# Patient Record
Sex: Female | Born: 1955 | Race: Black or African American | Hispanic: No | Smoking: Current every day smoker
Health system: Southern US, Community
[De-identification: ages and names within clinical notes are randomized; demographics above are authoritative.]

## PROBLEM LIST (undated history)

## (undated) DIAGNOSIS — G8929 Other chronic pain: Secondary | ICD-10-CM

## (undated) DIAGNOSIS — I10 Essential (primary) hypertension: Secondary | ICD-10-CM

## (undated) DIAGNOSIS — K859 Acute pancreatitis without necrosis or infection, unspecified: Secondary | ICD-10-CM

## (undated) DIAGNOSIS — J45909 Unspecified asthma, uncomplicated: Secondary | ICD-10-CM

## (undated) DIAGNOSIS — F419 Anxiety disorder, unspecified: Secondary | ICD-10-CM

## (undated) DIAGNOSIS — M1990 Unspecified osteoarthritis, unspecified site: Secondary | ICD-10-CM

## (undated) DIAGNOSIS — E559 Vitamin D deficiency, unspecified: Secondary | ICD-10-CM

## (undated) DIAGNOSIS — H409 Unspecified glaucoma: Secondary | ICD-10-CM

## (undated) HISTORY — DX: Vitamin D deficiency, unspecified: E55.9

## (undated) HISTORY — PX: SHOULDER SURGERY: SHX246

---

## 2003-03-31 ENCOUNTER — Emergency Department (HOSPITAL_COMMUNITY): Admission: EM | Admit: 2003-03-31 | Discharge: 2003-03-31 | Payer: Self-pay | Admitting: Emergency Medicine

## 2003-08-28 ENCOUNTER — Inpatient Hospital Stay (HOSPITAL_COMMUNITY): Admission: EM | Admit: 2003-08-28 | Discharge: 2003-09-01 | Payer: Self-pay | Admitting: Emergency Medicine

## 2006-06-17 ENCOUNTER — Other Ambulatory Visit: Payer: Self-pay

## 2006-06-30 ENCOUNTER — Ambulatory Visit: Payer: Self-pay | Admitting: Cardiovascular Disease

## 2006-07-27 ENCOUNTER — Inpatient Hospital Stay (HOSPITAL_COMMUNITY): Admission: EM | Admit: 2006-07-27 | Discharge: 2006-07-29 | Payer: Self-pay | Admitting: Emergency Medicine

## 2006-09-08 ENCOUNTER — Ambulatory Visit: Payer: Self-pay | Admitting: Orthopaedic Surgery

## 2007-05-06 IMAGING — CR DG CHEST 2V
2 series · 2 of 2 positions shown · non-contrast
Comparison: 08/31/03.

CLINICAL DATA: Smoker.  History of asthma.  Dyspnea and chest tightness.  
 CHEST - 2 VIEW:

[view not recorded (1 of 2)]
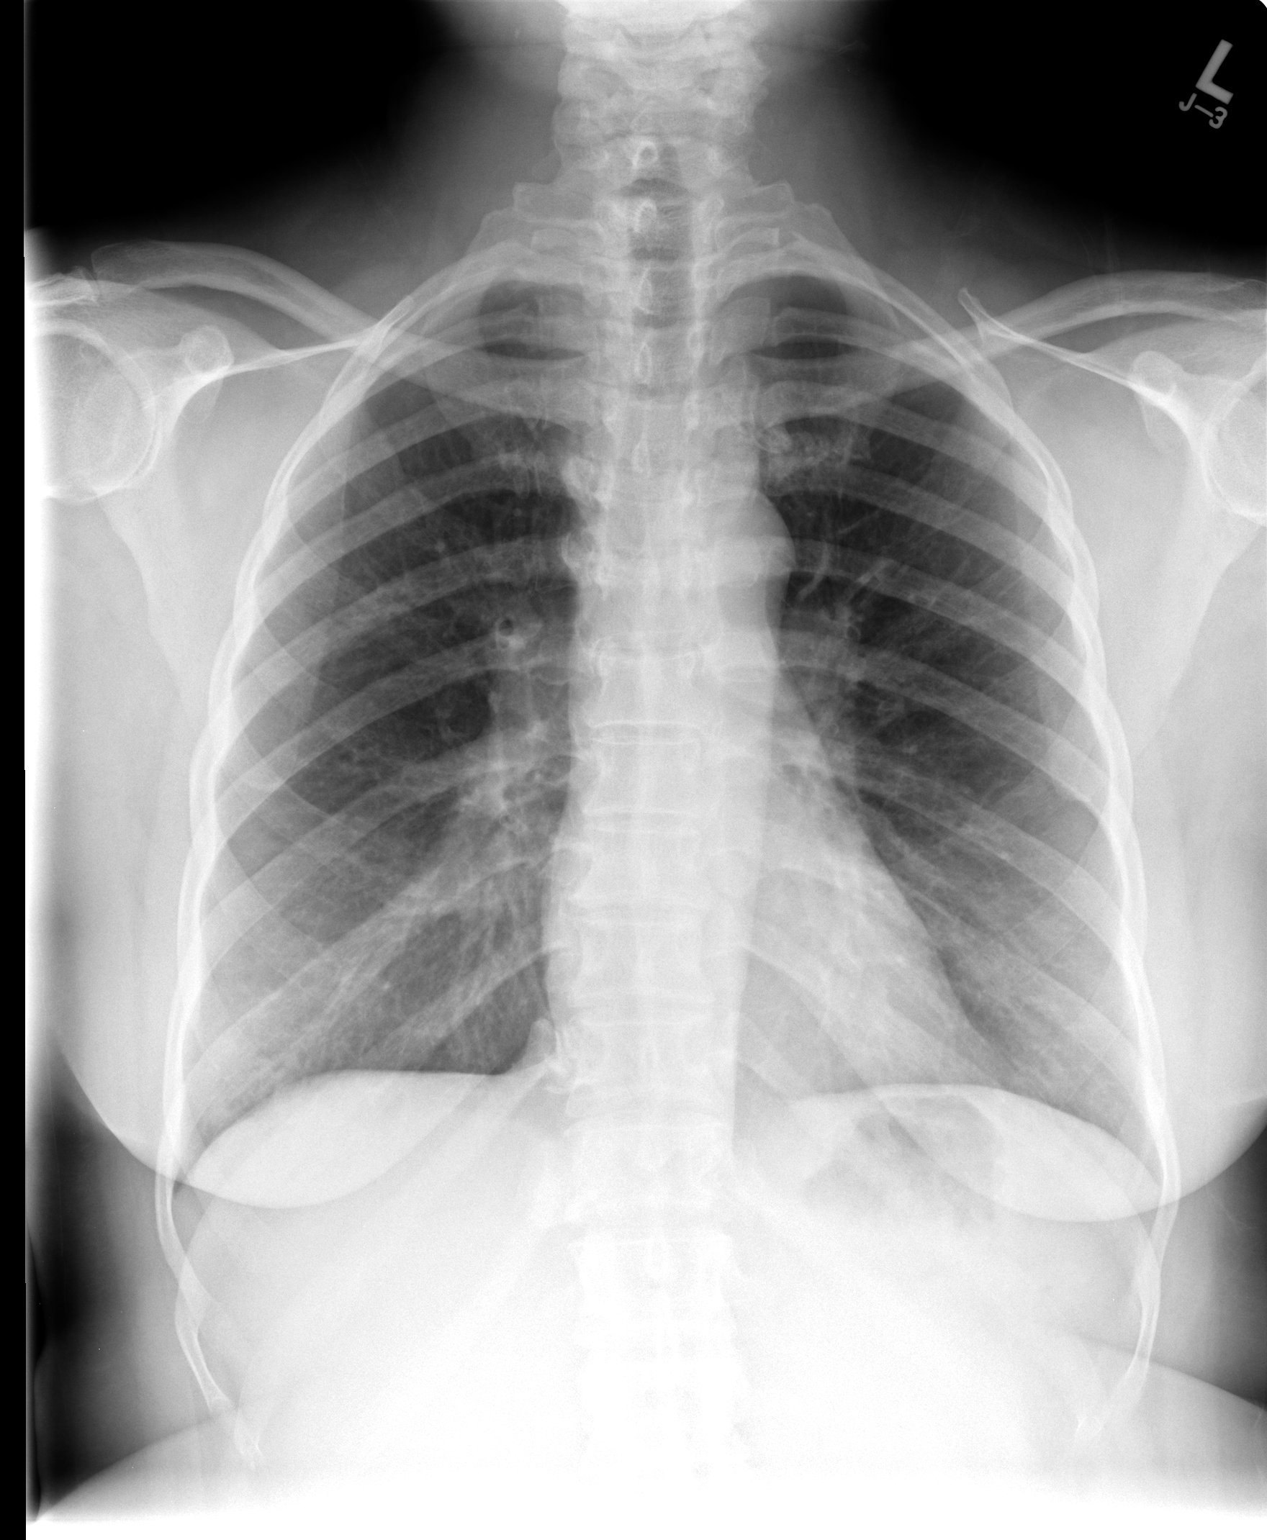

[view not recorded (2 of 2)]
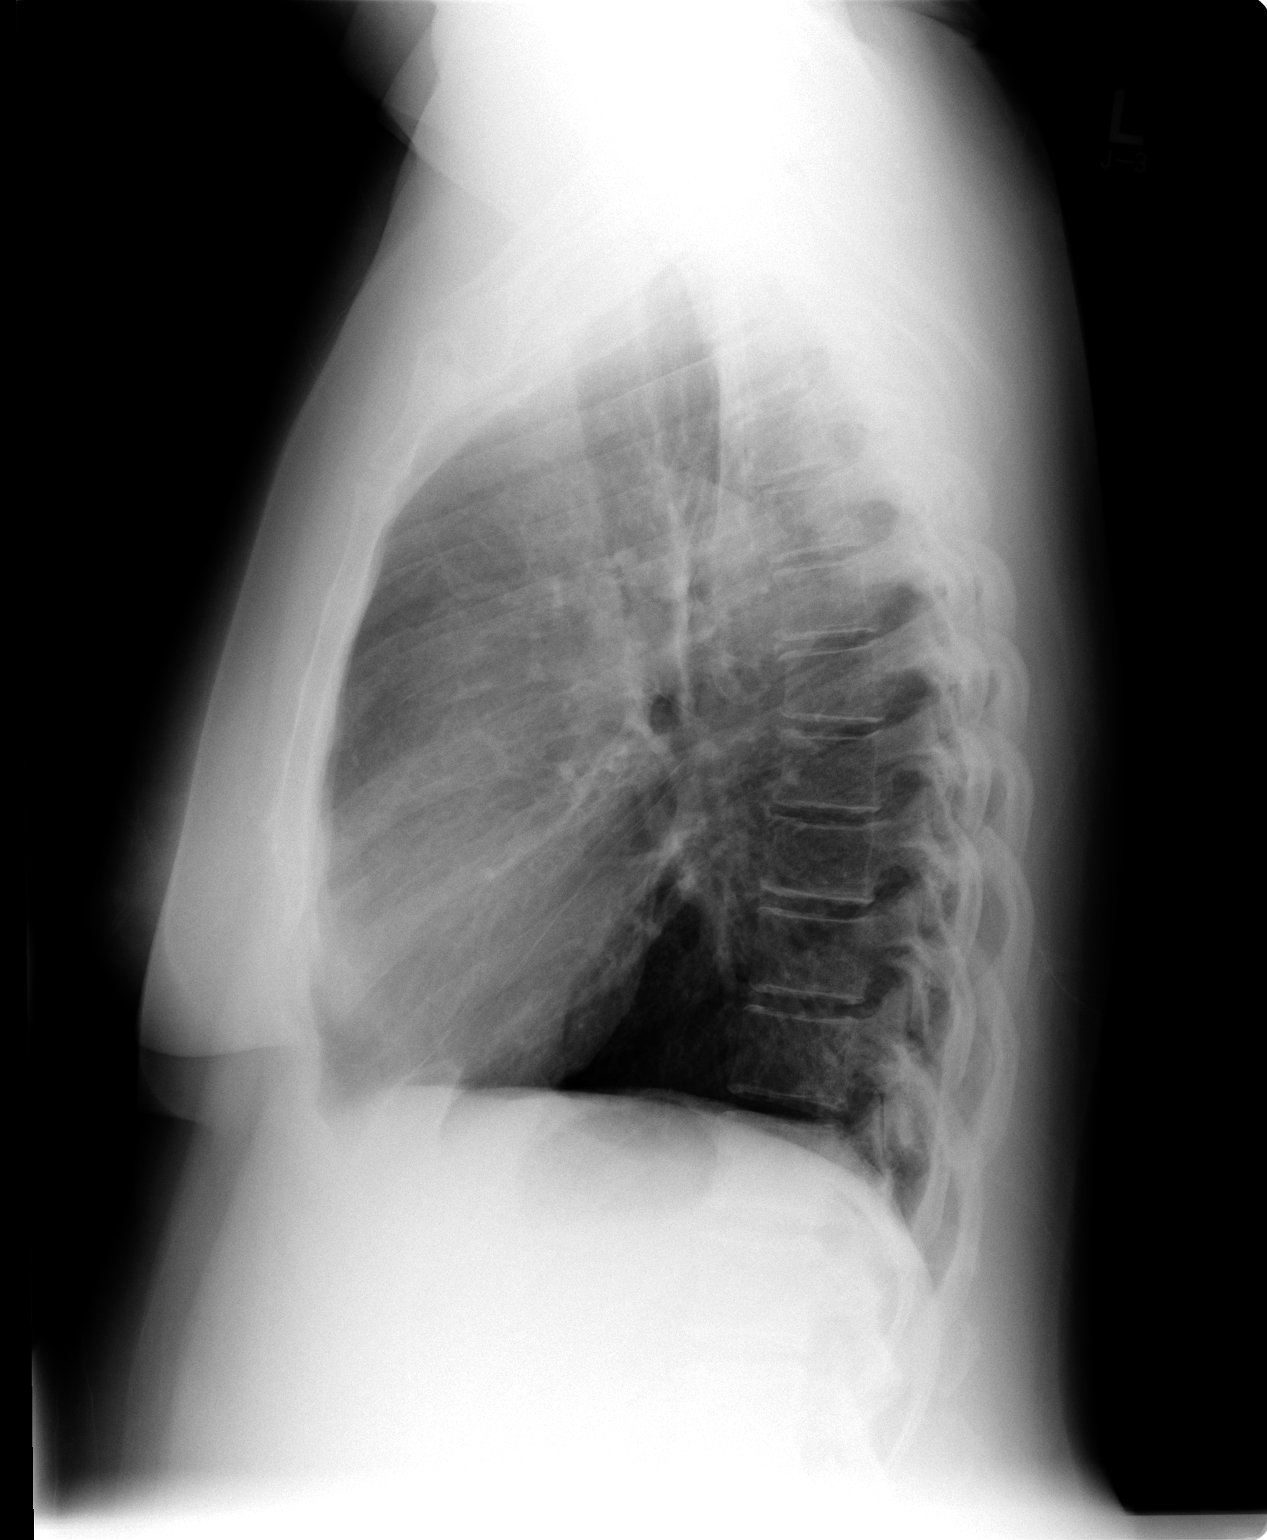

[2 of 2 positions shown; findings below may reference images not displayed]

FINDINGS: The previously demonstrated right lung air space disease has resolved. The lungs are now clear aside from chronic central airway thickening.  There is no pleural effusion or pneumothorax.  The cardiomediastinal contours appear stable.
IMPRESSION: Chronic central airway thickening compatible with bronchitis or asthma.  No evidence of pneumonia.

## 2007-06-21 ENCOUNTER — Ambulatory Visit (HOSPITAL_COMMUNITY): Admission: RE | Admit: 2007-06-21 | Discharge: 2007-06-21 | Payer: Self-pay | Admitting: Ophthalmology

## 2007-07-05 ENCOUNTER — Emergency Department (HOSPITAL_COMMUNITY): Admission: EM | Admit: 2007-07-05 | Discharge: 2007-07-05 | Payer: Self-pay | Admitting: Emergency Medicine

## 2007-07-07 ENCOUNTER — Ambulatory Visit (HOSPITAL_COMMUNITY): Admission: RE | Admit: 2007-07-07 | Discharge: 2007-07-07 | Payer: Self-pay | Admitting: Ophthalmology

## 2007-10-13 ENCOUNTER — Inpatient Hospital Stay (HOSPITAL_COMMUNITY): Admission: EM | Admit: 2007-10-13 | Discharge: 2007-11-02 | Payer: Self-pay | Admitting: Emergency Medicine

## 2007-10-18 ENCOUNTER — Ambulatory Visit: Payer: Self-pay | Admitting: Physical Medicine & Rehabilitation

## 2008-01-14 ENCOUNTER — Ambulatory Visit (HOSPITAL_COMMUNITY): Admission: RE | Admit: 2008-01-14 | Discharge: 2008-01-14 | Payer: Self-pay | Admitting: Orthopedic Surgery

## 2008-03-30 IMAGING — CR DG CHEST 2V
2 series · 2 of 2 positions shown · non-contrast
Comparison: 07/27/06.

CLINICAL DATA: Preoperative respiratory film. 
 CHEST - 2 VIEW:

[view not recorded (1 of 2)]
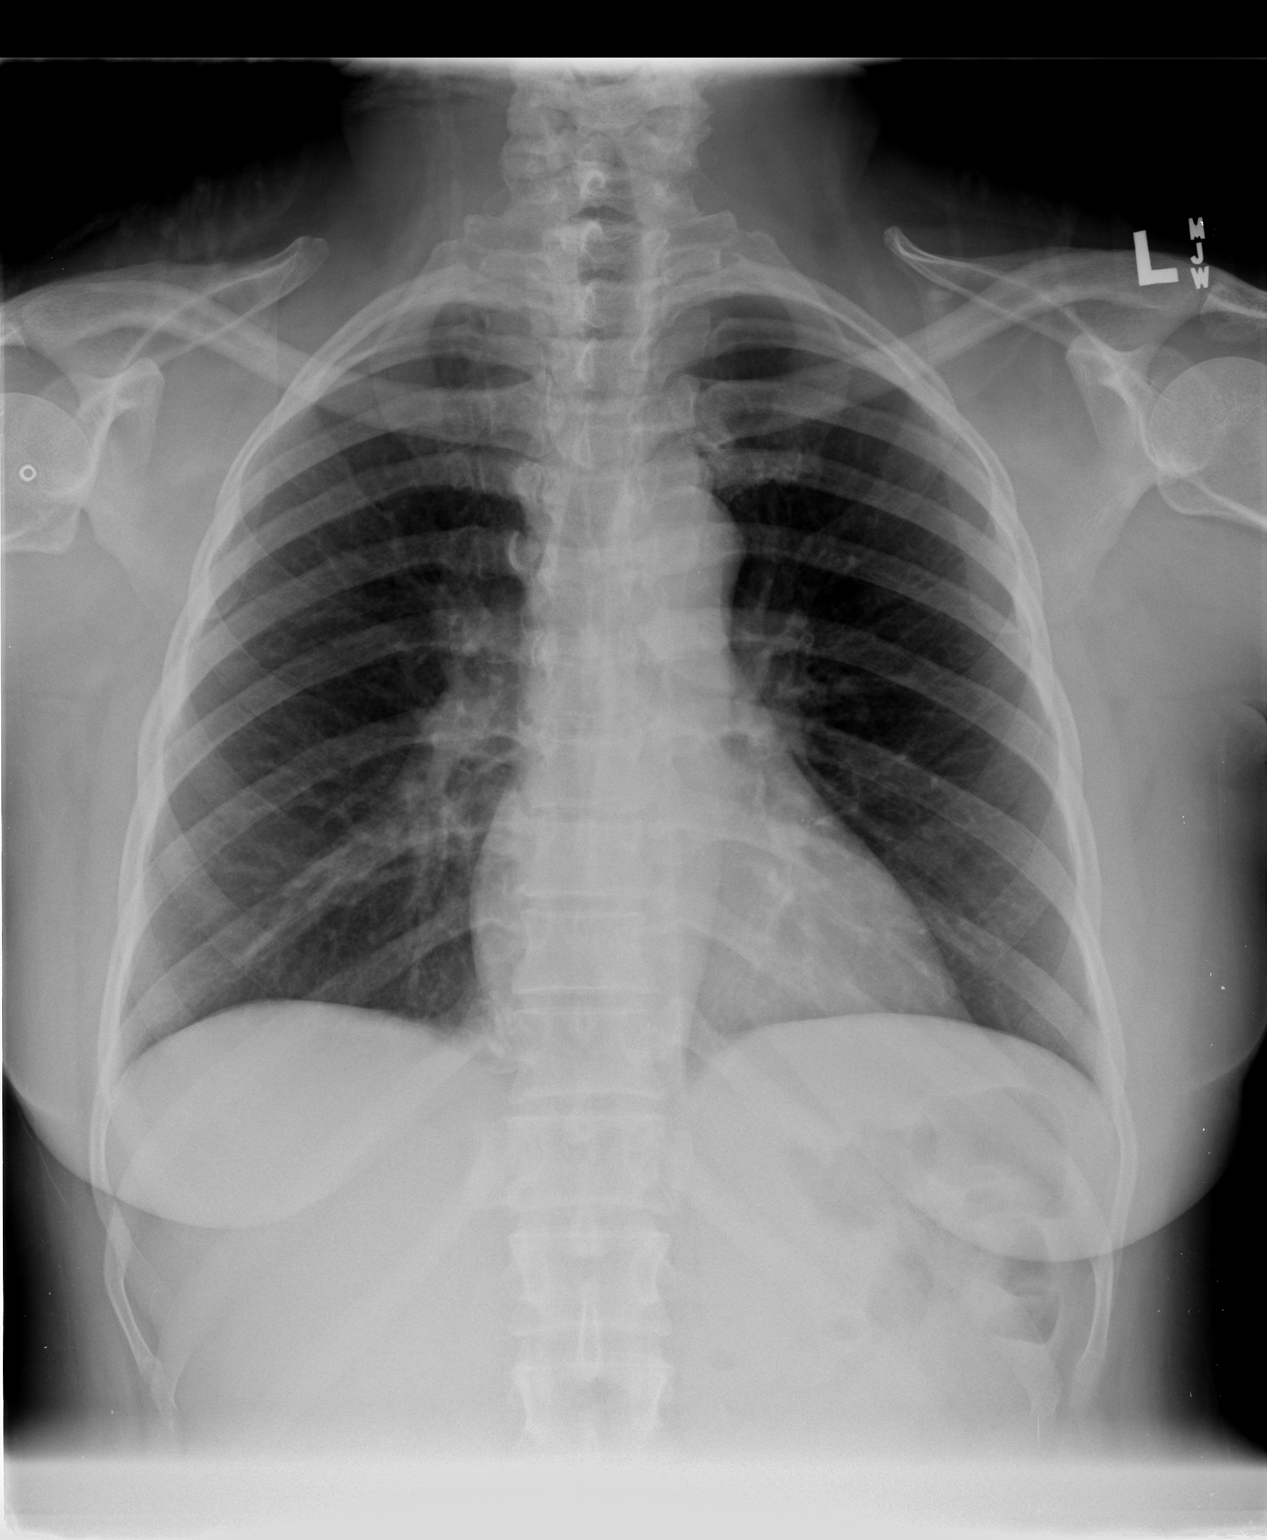

[view not recorded (2 of 2)]
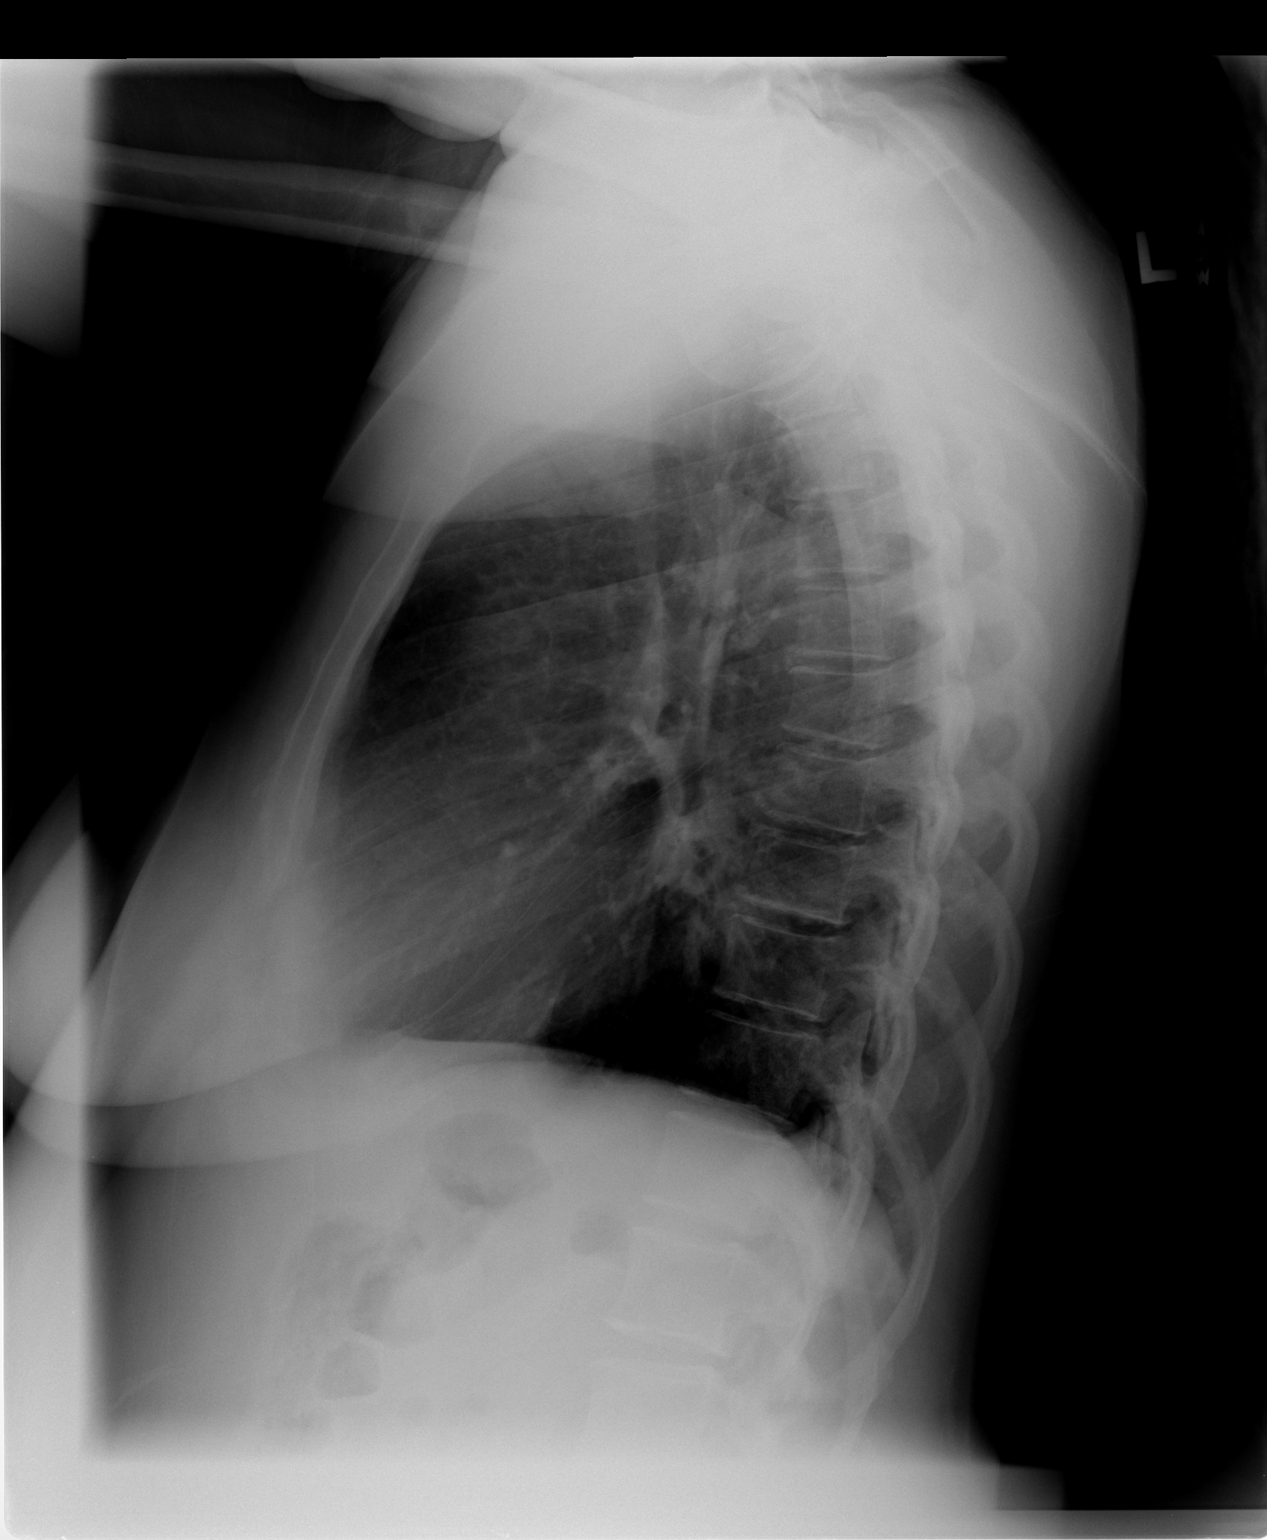

[2 of 2 positions shown; findings below may reference images not displayed]

FINDINGS: The lungs are clear.  Heart size is normal.  No effusion or focal bony abnormality.
IMPRESSION: No acute disease.

## 2008-10-16 ENCOUNTER — Encounter (INDEPENDENT_AMBULATORY_CARE_PROVIDER_SITE_OTHER): Payer: Self-pay | Admitting: *Deleted

## 2008-11-21 ENCOUNTER — Ambulatory Visit: Payer: Self-pay | Admitting: Gastroenterology

## 2008-11-21 DIAGNOSIS — K625 Hemorrhage of anus and rectum: Secondary | ICD-10-CM | POA: Insufficient documentation

## 2008-11-21 DIAGNOSIS — K5900 Constipation, unspecified: Secondary | ICD-10-CM | POA: Insufficient documentation

## 2008-11-22 ENCOUNTER — Encounter: Payer: Self-pay | Admitting: Internal Medicine

## 2008-11-26 HISTORY — PX: COLONOSCOPY: SHX174

## 2008-11-29 ENCOUNTER — Encounter: Payer: Self-pay | Admitting: Gastroenterology

## 2008-12-06 ENCOUNTER — Encounter: Payer: Self-pay | Admitting: Gastroenterology

## 2008-12-06 ENCOUNTER — Ambulatory Visit: Payer: Self-pay | Admitting: Gastroenterology

## 2008-12-06 ENCOUNTER — Ambulatory Visit (HOSPITAL_COMMUNITY): Admission: RE | Admit: 2008-12-06 | Discharge: 2008-12-06 | Payer: Self-pay | Admitting: Gastroenterology

## 2008-12-14 ENCOUNTER — Encounter (INDEPENDENT_AMBULATORY_CARE_PROVIDER_SITE_OTHER): Payer: Self-pay

## 2009-02-08 ENCOUNTER — Ambulatory Visit (HOSPITAL_COMMUNITY): Admission: RE | Admit: 2009-02-08 | Discharge: 2009-02-08 | Payer: Self-pay | Admitting: Ophthalmology

## 2009-02-20 ENCOUNTER — Ambulatory Visit: Payer: Self-pay | Admitting: Gastroenterology

## 2009-02-22 DIAGNOSIS — K649 Unspecified hemorrhoids: Secondary | ICD-10-CM | POA: Insufficient documentation

## 2009-03-29 ENCOUNTER — Encounter (HOSPITAL_COMMUNITY): Admission: RE | Admit: 2009-03-29 | Discharge: 2009-04-28 | Payer: Self-pay | Admitting: Orthopedic Surgery

## 2009-05-23 ENCOUNTER — Ambulatory Visit: Payer: Self-pay | Admitting: Physician Assistant

## 2009-06-08 ENCOUNTER — Emergency Department (HOSPITAL_COMMUNITY): Admission: EM | Admit: 2009-06-08 | Discharge: 2009-06-08 | Payer: Self-pay | Admitting: Emergency Medicine

## 2009-07-31 ENCOUNTER — Encounter (INDEPENDENT_AMBULATORY_CARE_PROVIDER_SITE_OTHER): Payer: Self-pay | Admitting: *Deleted

## 2009-09-03 ENCOUNTER — Ambulatory Visit: Payer: Self-pay | Admitting: Pain Medicine

## 2009-09-11 ENCOUNTER — Ambulatory Visit: Payer: Self-pay | Admitting: Pain Medicine

## 2009-10-17 ENCOUNTER — Encounter (HOSPITAL_COMMUNITY): Admission: RE | Admit: 2009-10-17 | Discharge: 2009-11-16 | Payer: Self-pay | Admitting: Anesthesiology

## 2010-05-19 ENCOUNTER — Encounter: Payer: Self-pay | Admitting: Cardiovascular Disease

## 2010-05-28 NOTE — Letter (Signed)
Summary: [facility]  [facility]  [address]   [place], [place] [address]   Phone: [phone]  Fax: [phone]    07/31/2009  [name] [address] [place], [place]  [address] 1955/05/28  Dear [facility],   Your physician has indicated that:   _______it is time to schedule an appointment.   _______you missed your appointment on______ and need to call and  reschedule.   _______you need to have lab work done.   _______you need to schedule an appointment to discuss lab or test results.   _______you need to call to reschedule your appointment that was scheduled on _________.   Please call our office at  [phone].    Thank you,    [facility] Ph: [phone]   Fax: [phone]

## 2010-08-01 LAB — BASIC METABOLIC PANEL
CO2: 28 mEq/L (ref 19–32)
Calcium: 9.9 mg/dL (ref 8.4–10.5)
GFR calc Af Amer: >60 mL/min (ref >60)
GFR calc non Af Amer: >60 mL/min (ref >60)
Potassium: 3.9 mEq/L (ref 3.5–5.1)
Sodium: 142 mEq/L (ref 135–145)

## 2010-08-01 LAB — HCG, QUANTITATIVE, PREGNANCY: hCG, Beta Chain, Quant, S: <2 m[IU]/mL (ref <5)

## 2010-08-01 LAB — HEMOGLOBIN AND HEMATOCRIT, BLOOD: Hemoglobin: 12.4 g/dL (ref 12.0–15.0)
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
## 2010-09-10 NOTE — Op Note (Signed)
[name], [name]               ACCOUNT NO.:  [id number]   MEDICAL RECORD NO.:  [id number]          PATIENT TYPE:  AMB   LOCATION:  DAY                           FACILITY:  [facility]   PHYSICIAN:  Kassie Mends, M.D.      DATE OF BIRTH:  1955-09-09   DATE OF PROCEDURE:  12/06/2008  DATE OF DISCHARGE:                               OPERATIVE REPORT   REFERRING Chen Saadeh:  Lesia Sago, NP   PROCEDURE:  Colonoscopy with snare cautery and cold forceps polypectomy.   INDICATION FOR EXAM:  Ms. House is a 55 year old female who presents  for rectal bleeding.   FINDINGS:  1. Slightly tortuous colon.  A 3-mm sessile transverse colon polyp      removed via cold forceps.  One 6 mm sessile sigmoid colon polyp      removed via snare cautery.  One 8-mm sessile sigmoid colon polyp      removed via cold forceps.  2. No masses, inflammatory changes, diverticula, or AVMs seen.  3. Small internal hemorrhoids.  Otherwise, normal retroflexed view of      the rectum.   DIAGNOSIS:  Rectal bleeding likely secondary to hemorrhoids and sigmoid  polyps.   RECOMMENDATIONS:  1. Screening colonoscopy in 10 years if she has a simple adenoma.  2. She should follow a high-fiber diet.  She was given a handout on      high-fiber diet, polyps, and hemorrhoids.  3. Will call her with the results of biopsies.  4. No aspirin, NSAIDs, or anticoagulation for 7 days.   MEDICATIONS:  1. Demerol 125 mg IV.  2. Versed 6 mg IV.  3. Phenergan 6.25 mg IV.   PROCEDURE TECHNIQUE:  Physical exam was performed.  Informed consent was  obtained from the patient after explaining the benefits, risks, and  alternatives to the procedure.  The patient was connected to the monitor  and placed in the left lateral position.  Continuous oxygen was provided  by nasal cannula, and IV medicine administered through an indwelling  cannula.  After administration of sedation and rectal exam, the  patient's rectum was intubated, and the scope  was advanced under direct  visualization to the cecum.  The ileocecal valve was normal.  The scope  was removed slowly by carefully examining the color, texture, anatomy,  and integrity of the mucosa way out.  The patient was recovered in  endoscopy and discharged home in satisfactory condition.   PATH:  SIMPLE ADENOMA      Kassie Mends, M.D.  Electronically Signed     SM/MEDQ  D:  12/06/2008  T:  12/06/2008  Job:  [id number]   cc:   Lesia Sago, NP
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
## 2010-09-10 NOTE — Op Note (Signed)
[name], [name]               ACCOUNT NO.:  [id number]   MEDICAL RECORD NO.:  [id number]          PATIENT TYPE:  AMB   LOCATION:  SDS                          FACILITY:  [facility]   PHYSICIAN:  Alford Highland. Rankin, M.D.   DATE OF BIRTH:  10/09/1955   DATE OF PROCEDURE:  07/07/2007  DATE OF DISCHARGE:  07/07/2007                               OPERATIVE REPORT   PREOPERATIVE DIAGNOSIS:  Epiretinal membrane with microtomographic  distortion, cystomicroedema and impact on visual acuity and function in  the right eye.   POSTOPERATIVE DIAGNOSIS:  Epiretinal membrane with microtomographic  distortion, cystomicroedema and impact on visual acuity and function in  the right eye.   PROCEDURE:  Posterior vitrectomy with membrane peel - epiretinal  membrane right eye - 25 gauge.   SURGEON:  Alford Highland. Rankin, MD.   ANESTHESIA:  Local retrobulbar anesthesia.   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  The patient understands this is  an attempt to remove the epiretinal membrane.  She understands the risks  of anesthesia including recurrence, death, __________ to the eye  including, but not limited to, hemorrhage, infection, scar, need for  further surgery, change in vision, loss of vision, progressive disease,  __________ , progression of cataract and/or glaucoma.  Informed signed  consent was obtained, patient taken to the operating room.  In the  operating room __________ was followed by mild sedation.  Xylocaine 2%  was injected 5 mL retrobulbar followed by additional 5 mL modified Darel Hong to the right eye.  The right eye was sterilely prepped and draped  with the usual ophthalmic pressure.  A lid speculum applied.  A 25 gauge  trocar placed inferotemporally.  A superior trocar was applied.  A core  vitrectomy was then begun.  This was removed without  difficulty.  Thereafter, 25-gauge forceps were then used to engage the epiretinal  membrane, this was removed as a continuous sheet off the macular region.  Significant change in macular tomographic distortion was noted.  At this  time, an attempt was made to carefully inspect, look for significant  internal limiting membrane stria.  There were none that appeared to  impact on the fovea.  For this reason, no further attempt was made to  remove it.   At this time instruments were removed from the eye.  Retinal periphery  was inspected and found to be free of retinal holes or tears.  Superior  trocars were removed.  The wound was closed nicely.  The infusion  removed.  Subconjunctival Decadron applied.  A sterile patch and Fox shield  applied to right eye.  Patient tolerated procedure well without  complication.  Patient had a sterile patch and Fox shield, taken to  [employer] [facility], discharged home as an outpatient.      Alford Highland Rankin, M.D.  Electronically Signed     GAR/MEDQ  D:  07/07/2007  T:  07/08/2007  Job:  [id number]   cc:  Susanne Greenhouse, MD
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
## 2010-09-13 NOTE — Procedures (Signed)
[name], [name]                           ACCOUNT NO.:  [id number]   MEDICAL RECORD NO.:  [id number]                   PATIENT TYPE:  INP   LOCATION:  IC08                                 FACILITY:  [facility]   PHYSICIAN:  Vida Roller, M.D.                DATE OF BIRTH:  1956/04/13   DATE OF PROCEDURE:  DATE OF DISCHARGE:                                  ECHOCARDIOGRAM   TAPE NUMBER:  [id number], tape count 1046 through 1494.   INDICATION:  A 55 year old female with cocaine abuse.  No previous  echocardiograms.   TECHNICAL QUALITY:  Adequate.   M-MODE TRACINGS:  1. The aorta is 29 mm.  2. The left atrium is 35 mm.  3. The septum is 11 mm.  4. Posterior wall is 11 mm.  5. The left ventricular diastolic dimension is 43 mm.  6. Left ventricular systolic dimension is 25 mm.   2-D AND DOPPLER IMAGING:  1. The left ventricle is normal size with normal systolic function.  There     are no wall motion abnormalities seen.  2. The diastolic function appears to be adequate.  3. The right ventricle is normal size with normal systolic function.  4. Both the atria appear to be top normal in size.  There is no atrial     septal defect.  5. The aortic valve is trileaflet and tricommisure with no evident stenosis     or regurgitation.  6. The mitral valve appears to be morphologically unremarkable with no     evidence of stenosis or regurgitation.  7. The tricuspid valve has trivial insufficiency.  8. The pulmonic valve has trivial insufficiency.  9. Pericardial structures are normal.  10.      The inferior vena cava appears to be normal size.  11.      The ascending aorta was not well seen.      ___________________________________________                                            Vida Roller, M.D.   JH/MEDQ  D:  08/29/2003  T:  08/29/2003  Job:  [id number]

## 2010-09-13 NOTE — Discharge Summary (Signed)
[name], ROUTE               ACCOUNT NO.:  [id number]   MEDICAL RECORD NO.:  [id number]          PATIENT TYPE:  INP   LOCATION:  A339                          FACILITY:  [facility]   PHYSICIAN:  Marcello Moores, MD   DATE OF BIRTH:  [birth year]   DATE OF ADMISSION:  07/27/2006  DATE OF DISCHARGE:  LH                               DISCHARGE SUMMARY   PMD unassigned.   ADMISSION AND HOSPITAL COURSE:  [name] is a 55 year old female  patient with history of bronchial asthma and she was presented with  shortness of breath for 3 days and she was admitted with exacerbation of  her bronchial asthma and IV Solu-Medrol and respiratory treatment was  given in the emergency room where she did not respond and she was  admitted for further management.  The patient is improving on her  respiratory treatment of Solu-Medrol IV and she will be able to be  discharged tomorrow morning.  Otherwise she has no fever and no other  complaints.   PHYSICAL EXAMINATION:  VITAL SIGNS:  Blood pressure is 108/87,  temperature is 97.4 and pulse is 64, respiratory rate is 20, saturation  is 99%.  HEENT: She has pink conjunctivae, nonicteric sclera.  NECK:  Neck is supple.  CHEST: Good air entry bilaterally with rhonchi and scattered wheezes.  CVS: S1-S2 regular, no murmur.  ABDOMEN:  Abdomen is soft, normoactive bowel sounds.  EXTREMITIES: No pedal edema.  CNS: She is alert and oriented.   LABORATORY:  Her labs were normal yesterday during the admission except  potassium 3.4 which was replaced.   ASSESSMENT:  1. Discharge.  2. Exacerbation of asthma, resolving.  3. Hypertension, fairly controlled.  4. Chronic smoking and the patient was advised to try to stop her      smoking.   MEDICATION:  1. Lisinopril 10 mg p.o. daily.  2. Albuterol inhaler 2 puffs p.r.n.    She will be given scheduled as per the [facility] primary  doctors scheduled to have follow-up with her PMD.      Marcello Moores, MD  Electronically Signed     MT/MEDQ  D:  07/28/2006  T:  07/29/2006  Job:  [id number]
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
## 2011-01-17 LAB — CBC
HCT: 33.1 — ABNORMAL LOW
Hemoglobin: 11.1 — ABNORMAL LOW
WBC: 7.1

## 2011-01-17 LAB — BASIC METABOLIC PANEL
GFR calc non Af Amer: >60
Potassium: 3.3 — ABNORMAL LOW
Sodium: 134 — ABNORMAL LOW

## 2011-01-20 LAB — CBC
HCT: 33.4 — ABNORMAL LOW
MCV: 87.3
Platelets: 441 — ABNORMAL HIGH
RDW: 15.8 — ABNORMAL HIGH

## 2011-01-20 LAB — BASIC METABOLIC PANEL
BUN: 11
CO2: 27
Chloride: 102
Glucose, Bld: 105 — ABNORMAL HIGH
Potassium: 3.6

## 2011-01-23 LAB — CBC
HCT: 25 — ABNORMAL LOW
HCT: 25.7 — ABNORMAL LOW
HCT: 28 — ABNORMAL LOW
HCT: 28.3 — ABNORMAL LOW
HCT: 28.5 — ABNORMAL LOW
Hemoglobin: 10.8 — ABNORMAL LOW
Hemoglobin: 8 — ABNORMAL LOW
Hemoglobin: 9.3 — ABNORMAL LOW
Hemoglobin: 9.4 — ABNORMAL LOW
MCHC: 32.7
MCHC: 32.7
MCHC: 33.2
MCHC: 33.7
MCV: 86.1
Platelets: 315
Platelets: 315
Platelets: 385
Platelets: 449 — ABNORMAL HIGH
RDW: 13.8
RDW: 13.9
RDW: 14.1
RDW: 14.2
RDW: 14.2
RDW: 14.4
WBC: 8
WBC: 8.3

## 2011-01-23 LAB — BASIC METABOLIC PANEL
BUN: 3 — ABNORMAL LOW
BUN: 6
BUN: 7
CO2: 28
Calcium: 8.6
Creatinine, Ser: 0.64
GFR calc non Af Amer: >60
GFR calc non Af Amer: >60
GFR calc non Af Amer: >60
Glucose, Bld: 92
Glucose, Bld: 99
Potassium: 2.9 — ABNORMAL LOW
Potassium: 3.3 — ABNORMAL LOW
Potassium: 3.7

## 2011-01-23 LAB — POCT I-STAT, CHEM 8
Creatinine, Ser: 0.9
Glucose, Bld: 213 — ABNORMAL HIGH
Hemoglobin: 11.9 — ABNORMAL LOW
Potassium: 4

## 2011-01-23 LAB — URINALYSIS, MICROSCOPIC ONLY
Bilirubin Urine: NEGATIVE
Glucose, UA: NEGATIVE
Ketones, ur: NEGATIVE
Nitrite: NEGATIVE
pH: 6

## 2011-01-23 LAB — POCT I-STAT 3, ART BLOOD GAS (G3+)
Bicarbonate: 26.1 — ABNORMAL HIGH
O2 Saturation: 100
Patient temperature: 95.7
TCO2: 28

## 2011-01-23 LAB — CROSSMATCH
ABO/RH(D): O POS
Antibody Screen: NEGATIVE

## 2011-01-23 LAB — PROTIME-INR: INR: 1.2

## 2011-01-23 LAB — RAPID URINE DRUG SCREEN, HOSP PERFORMED: Barbiturates: NOT DETECTED

## 2011-04-23 ENCOUNTER — Emergency Department (HOSPITAL_COMMUNITY)
Admission: EM | Admit: 2011-04-23 | Discharge: 2011-04-23 | Disposition: A | Discharge status: Home / Self Care | Payer: No Typology Code available for payment source | Attending: Emergency Medicine | Admitting: Emergency Medicine

## 2011-04-23 ENCOUNTER — Encounter: Payer: Self-pay | Admitting: Emergency Medicine

## 2011-04-23 DIAGNOSIS — Z79899 Other long term (current) drug therapy: Secondary | ICD-10-CM | POA: Insufficient documentation

## 2011-04-23 DIAGNOSIS — R51 Headache: Secondary | ICD-10-CM | POA: Insufficient documentation

## 2011-04-23 DIAGNOSIS — S8002XA Contusion of left knee, initial encounter: Secondary | ICD-10-CM

## 2011-04-23 DIAGNOSIS — M25569 Pain in unspecified knee: Secondary | ICD-10-CM | POA: Insufficient documentation

## 2011-04-23 DIAGNOSIS — IMO0001 Reserved for inherently not codable concepts without codable children: Secondary | ICD-10-CM | POA: Insufficient documentation

## 2011-04-23 DIAGNOSIS — M25469 Effusion, unspecified knee: Secondary | ICD-10-CM | POA: Insufficient documentation

## 2011-04-23 DIAGNOSIS — S8000XA Contusion of unspecified knee, initial encounter: Secondary | ICD-10-CM | POA: Insufficient documentation

## 2011-04-23 NOTE — ED Notes (Signed)
PT NOW COMPLAINS OF HEADACHE
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
## 2011-04-23 NOTE — ED Notes (Signed)
Pt left prior to ortho tech apply knee sleeve. Pt and belongings are out of room.

## 2011-04-27 NOTE — ED Provider Notes (Signed)
Medical screening examination/treatment/procedure(s) were conducted as a shared visit with non-physician practitioner(s) and myself.  I personally evaluated the patient during the encounter  Hurman Horn, MD 04/27/11 2135

## 2011-07-15 ENCOUNTER — Other Ambulatory Visit: Payer: Self-pay | Admitting: Family Medicine

## 2011-07-15 DIAGNOSIS — N6489 Other specified disorders of breast: Secondary | ICD-10-CM

## 2011-07-15 DIAGNOSIS — R928 Other abnormal and inconclusive findings on diagnostic imaging of breast: Secondary | ICD-10-CM

## 2011-07-15 DIAGNOSIS — R922 Inconclusive mammogram: Secondary | ICD-10-CM

## 2011-07-24 ENCOUNTER — Other Ambulatory Visit: Payer: No Typology Code available for payment source

## 2011-08-07 ENCOUNTER — Other Ambulatory Visit: Payer: No Typology Code available for payment source

## 2011-08-11 ENCOUNTER — Ambulatory Visit
Admission: RE | Admit: 2011-08-11 | Discharge: 2011-08-11 | Disposition: A | Discharge status: Home / Self Care | Payer: No Typology Code available for payment source | Source: Ambulatory Visit | Attending: Family Medicine | Admitting: Family Medicine

## 2011-08-11 DIAGNOSIS — R922 Inconclusive mammogram: Secondary | ICD-10-CM

## 2011-08-11 DIAGNOSIS — R928 Other abnormal and inconclusive findings on diagnostic imaging of breast: Secondary | ICD-10-CM

## 2011-08-11 DIAGNOSIS — N6489 Other specified disorders of breast: Secondary | ICD-10-CM

## 2011-08-11 DIAGNOSIS — R9230 Dense breasts, unspecified: Secondary | ICD-10-CM

## 2012-06-18 ENCOUNTER — Encounter (HOSPITAL_COMMUNITY): Payer: Self-pay | Admitting: *Deleted

## 2012-06-18 ENCOUNTER — Emergency Department (HOSPITAL_COMMUNITY)
Admission: EM | Admit: 2012-06-18 | Discharge: 2012-06-18 | Disposition: A | Discharge status: Home / Self Care | Payer: Medicaid Other | Attending: Emergency Medicine | Admitting: Emergency Medicine

## 2012-06-18 DIAGNOSIS — I10 Essential (primary) hypertension: Secondary | ICD-10-CM | POA: Insufficient documentation

## 2012-06-18 DIAGNOSIS — Z760 Encounter for issue of repeat prescription: Secondary | ICD-10-CM | POA: Insufficient documentation

## 2012-06-18 DIAGNOSIS — M545 Low back pain, unspecified: Secondary | ICD-10-CM | POA: Insufficient documentation

## 2012-06-18 DIAGNOSIS — G8929 Other chronic pain: Secondary | ICD-10-CM | POA: Insufficient documentation

## 2012-06-18 DIAGNOSIS — Z79899 Other long term (current) drug therapy: Secondary | ICD-10-CM | POA: Insufficient documentation

## 2012-06-18 DIAGNOSIS — F17200 Nicotine dependence, unspecified, uncomplicated: Secondary | ICD-10-CM | POA: Insufficient documentation

## 2012-06-18 DIAGNOSIS — J45909 Unspecified asthma, uncomplicated: Secondary | ICD-10-CM | POA: Insufficient documentation

## 2012-06-18 HISTORY — DX: Essential (primary) hypertension: I10

## 2012-06-18 HISTORY — DX: Unspecified asthma, uncomplicated: J45.909

## 2012-06-18 HISTORY — DX: Other chronic pain: G89.29

## 2012-06-18 MED ORDER — OXYCODONE-ACETAMINOPHEN 10-325 MG PO TABS: ORAL_TABLET | ORAL | Status: DC
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
## 2012-06-20 NOTE — ED Provider Notes (Signed)
Medical screening examination/treatment/procedure(s) were performed by non-physician practitioner and as supervising physician I was immediately available for consultation/collaboration.   Benny Lennert, MD 06/20/12 2242

## 2015-10-04 ENCOUNTER — Ambulatory Visit: Payer: Medicaid Other | Admitting: Physical Therapy

## 2015-12-05 ENCOUNTER — Ambulatory Visit: Payer: Medicaid Other

## 2015-12-05 ENCOUNTER — Ambulatory Visit: Payer: Medicaid Other | Attending: Orthopedic Surgery

## 2015-12-05 DIAGNOSIS — M545 Low back pain, unspecified: Secondary | ICD-10-CM

## 2015-12-05 DIAGNOSIS — M546 Pain in thoracic spine: Secondary | ICD-10-CM | POA: Insufficient documentation

## 2015-12-05 NOTE — Therapy (Signed)
[place], [place], [address] Phone: [phone]   Fax:  [phone]  Patient Details  Name: [name] MRN: [id number] Date of Birth: November 11, 1955 Referring Provider:  Dorna Leitz, MD  Encounter Date: 12/05/2015  [name] arrived for [facility] Evaluation and the intake was started with the normal set of questions and before we were able to begin the assessment she became agitated with the time taken by the intake, the fact I could not input one of her medication as the options did not include the dosage she reported that she was taking and decided to stop the eval and stated she would get the eval later when she could have more time with getting an exercise program as the intake info would be already in the system.   I stated it was her choice and it was OK to stop.  Sorry we could not complete the eval and issueing of HEP.    We would be glad to do this in future.                                                                Darrel Hoover 12/05/2015, 12:13 PM  [facility] [address] [place], [place], [address] Phone: [phone]   Fax:  [phone]

## 2016-01-23 ENCOUNTER — Other Ambulatory Visit: Payer: Self-pay | Admitting: Family Medicine

## 2016-01-23 DIAGNOSIS — R1011 Right upper quadrant pain: Secondary | ICD-10-CM

## 2016-01-29 ENCOUNTER — Ambulatory Visit: Payer: Medicaid Other

## 2016-02-05 ENCOUNTER — Ambulatory Visit: Payer: Medicaid Other

## 2016-02-12 ENCOUNTER — Ambulatory Visit: Payer: Medicaid Other | Admitting: Gastroenterology

## 2016-02-15 ENCOUNTER — Ambulatory Visit: Payer: Medicaid Other | Admitting: Gastroenterology

## 2016-02-20 ENCOUNTER — Ambulatory Visit (INDEPENDENT_AMBULATORY_CARE_PROVIDER_SITE_OTHER): Payer: Medicaid Other | Admitting: Gastroenterology

## 2016-02-20 ENCOUNTER — Encounter: Payer: Self-pay | Admitting: Gastroenterology

## 2016-02-20 ENCOUNTER — Other Ambulatory Visit: Payer: Self-pay

## 2016-02-20 VITALS — BP 130/76 | HR 55 | Temp 97.9°F | Ht 61.5 in | Wt 177.2 lb

## 2016-02-20 DIAGNOSIS — K5900 Constipation, unspecified: Secondary | ICD-10-CM | POA: Diagnosis not present

## 2016-02-20 DIAGNOSIS — K625 Hemorrhage of anus and rectum: Secondary | ICD-10-CM

## 2016-02-20 DIAGNOSIS — Z86010 Personal history of colonic polyps: Secondary | ICD-10-CM

## 2016-02-20 MED ORDER — PEG-KCL-NACL-NASULF-NA ASC-C 100 G PO SOLR: 1.0000 | ORAL | 0 refills | Status: DC

## 2016-02-20 MED ORDER — LINACLOTIDE 290 MCG PO CAPS: 290.0000 ug | ORAL_CAPSULE | Freq: Every day | ORAL | 5 refills | Status: DC

## 2016-02-20 NOTE — Patient Instructions (Signed)
1. Stop Amitiza. 2. Start Linzess 275mcg daily for constipation. RX sent to pharmacy. If you fail this medication, then we can get Medicaid to pay for Movantik (which is used specifically for patients with constipation related to pain medications).  3. Colonoscopy as scheduled. See separate instructions.

## 2016-02-20 NOTE — Assessment & Plan Note (Signed)
60 year old female presenting for further evaluation of change in bowel habits with constipation over the past 2 years, rectal bleeding occurring twice monthly with recent moderate volume hematochezia as outlined above. Also with history of adenomatous colon polyps. 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 Likely benign anorectal source for bleeding however given change in bowel habits and history of adenomatous colon polyps I would advise colonoscopy at this time. Plan for deep sedation in the OR due to polypharmacy, chronic narcotics.  I have discussed the risks, alternatives, benefits with regards to but not limited to the risk of reaction to medication, bleeding, infection, perforation and the patient is agreeable to proceed. Written consent to be obtained.  Stop amitiza. Would prefer to use Movantik but Medicaid requires failure to both Amitiza and Linzess. Start Linzess 238mcg daily. She will let me know if it is ineffective and then we will push for Movantik.

## 2016-02-20 NOTE — Progress Notes (Signed)
CC'ED TO PCP 
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
## 2016-02-26 ENCOUNTER — Ambulatory Visit
Admission: RE | Admit: 2016-02-26 | Discharge: 2016-02-26 | Disposition: A | Discharge status: Home / Self Care | Payer: Medicaid Other | Source: Ambulatory Visit | Attending: Family Medicine | Admitting: Family Medicine

## 2016-02-26 DIAGNOSIS — R1011 Right upper quadrant pain: Secondary | ICD-10-CM | POA: Diagnosis present

## 2016-03-17 NOTE — Patient Instructions (Signed)
[name]  03/17/2016     @PREFPERIOPPHARMACY @   Your procedure is scheduled on  03/25/2016   Report to [facility] at  64  A.M.  Call this number if you have problems the morning of surgery:  [phone]   Remember:  Do not eat food or drink liquids after midnight.  Take these medicines the morning of surgery with A SIP OF WATER  Xanax, norvasc, zyrtec, linzess, percocet.   Do not wear jewelry, make-up or nail polish.  Do not wear lotions, powders, or perfumes, or deoderant.  Do not shave 48 hours prior to surgery.  Men may shave face and neck.  Do not bring valuables to the hospital.  [facility] is not responsible for any belongings or valuables.  Contacts, dentures or bridgework may not be worn into surgery.  Leave your suitcase in the car.  After surgery it may be brought to your room.  For patients admitted to the hospital, discharge time will be determined by your treatment team.  Patients discharged the day of surgery will not be allowed to drive home.   Name and phone number of your driver:   family Special instructions:  Follow the diet and prep instructions given to you by Dr Nona Dell office.  Please read over the following fact sheets that you were given. Anesthesia Post-op Instructions and Care and Recovery After Surgery       Colonoscopy, Adult A colonoscopy is an exam to look at the entire large intestine. During the exam, a lubricated, bendable tube is inserted into the anus and then passed into the rectum, colon, and other parts of the large intestine. A colonoscopy is often done as a part of normal colorectal screening or in response to certain symptoms, such as anemia, persistent diarrhea, abdominal pain, and blood in the stool. The exam can help screen for and diagnose medical problems, including:  Tumors.  Polyps.  Inflammation.  Areas of bleeding. Tell a health care provider about:  Any allergies you have.  All  medicines you are taking, including vitamins, herbs, eye drops, creams, and over-the-counter medicines.  Any problems you or family members have had with anesthetic medicines.  Any blood disorders you have.  Any surgeries you have had.  Any medical conditions you have.  Any problems you have had passing stool. What are the risks? Generally, this is a safe procedure. However, problems may occur, including:  Bleeding.  A tear in the intestine.  A reaction to medicines given during the exam.  Infection (rare). What happens before the procedure? Eating and drinking restrictions  Follow instructions from your health care provider about eating and drinking, which may include:  A few days before the procedure - follow a low-fiber diet. Avoid nuts, seeds, dried fruit, raw fruits, and vegetables.  1-3 days before the procedure - follow a clear liquid diet. Drink only clear liquids, such as clear broth or bouillon, black coffee or tea, clear juice, clear soft drinks or sports drinks, gelatin desert, and popsicles. Avoid any liquids that contain red or purple dye.  On the day of the procedure - do not eat or drink anything during the 2 hours before the procedure, or within the time period that your health care provider recommends. Bowel prep  If you were prescribed an oral bowel prep to clean out your colon:  Take it as told by your health care provider. Starting the day before your  procedure, you will need to drink a large amount of medicated liquid. The liquid will cause you to have multiple loose stools until your stool is almost clear or light green.  If your skin or anus gets irritated from diarrhea, you may use these to relieve the irritation:  Medicated wipes, such as adult wet wipes with aloe and vitamin E.  A skin soothing-product like petroleum jelly.  If you vomit while drinking the bowel prep, take a break for up to 60 minutes and then begin the bowel prep again. If vomiting  continues and you cannot take the bowel prep without vomiting, call your health care provider. General instructions  Ask your health care provider about changing or stopping your regular medicines. This is especially important if you are taking diabetes medicines or blood thinners.  Plan to have someone take you home from the hospital or clinic. What happens during the procedure?  An IV tube may be inserted into one of your veins.  You will be given medicine to help you relax (sedative).  To reduce your risk of infection:  Your health care team will wash or sanitize their hands.  Your anal area will be washed with soap.  You will be asked to lie on your side with your knees bent.  Your health care provider will lubricate a long, thin, flexible tube. The tube will have a camera and a light on the end.  The tube will be inserted into your anus.  The tube will be gently eased through your rectum and colon.  Air will be delivered into your colon to keep it open. You may feel some pressure or cramping.  The camera will be used to take images during the procedure.  A small tissue sample may be removed from your body to be examined under a microscope (biopsy). If any potential problems are found, the tissue will be sent to a lab for testing.  If small polyps are found, your health care provider may remove them and have them checked for cancer cells.  The tube that was inserted into your anus will be slowly removed. The procedure may vary among health care providers and hospitals. What happens after the procedure?  Your blood pressure, heart rate, breathing rate, and blood oxygen level will be monitored until the medicines you were given have worn off.  Do not drive for 24 hours after the exam.  You may have a small amount of blood in your stool.  You may pass gas and have mild abdominal cramping or bloating due to the air that was used to inflate your colon during the exam.  It  is up to you to get the results of your procedure. Ask your health care provider, or the department performing the procedure, when your results will be ready. This information is not intended to replace advice given to you by your health care provider. Make sure you discuss any questions you have with your health care provider. Document Released: 04/11/2000 Document Revised: 11/02/2015 Document Reviewed: 06/26/2015 Elsevier Interactive Patient Education  2017 [employer]  Colonoscopy, Adult, Care After This sheet gives you information about how to care for yourself after your procedure. Your health care provider may also give you more specific instructions. If you have problems or questions, contact your health care provider. What can I expect after the procedure? After the procedure, it is common to have:  A small amount of blood in your stool for 24 hours after the procedure.  Some gas.  Mild abdominal cramping or bloating. Follow these instructions at home: General instructions  For the first 24 hours after the procedure:  Do not drive or use machinery.  Do not sign important documents.  Do not drink alcohol.  Do your regular daily activities at a slower pace than normal.  Eat soft, easy-to-digest foods.  Rest often.  Take over-the-counter or prescription medicines only as told by your health care provider.  It is up to you to get the results of your procedure. Ask your health care provider, or the department performing the procedure, when your results will be ready. Relieving cramping and bloating  Try walking around when you have cramps or feel bloated.  Apply heat to your abdomen as told by your health care provider. Use a heat source that your health care provider recommends, such as a moist heat pack or a heating pad.  Place a towel between your skin and the heat source.  Leave the heat on for 20-30 minutes.  Remove the heat if your skin turns bright red. This  is especially important if you are unable to feel pain, heat, or cold. You may have a greater risk of getting burned. Eating and drinking  Drink enough fluid to keep your urine clear or pale yellow.  Resume your normal diet as instructed by your health care provider. Avoid heavy or fried foods that are hard to digest.  Avoid drinking alcohol for as long as instructed by your health care provider. Contact a health care provider if:  You have blood in your stool 2-3 days after the procedure. Get help right away if:  You have more than a small spotting of blood in your stool.  You pass large blood clots in your stool.  Your abdomen is swollen.  You have nausea or vomiting.  You have a fever.  You have increasing abdominal pain that is not relieved with medicine. This information is not intended to replace advice given to you by your health care provider. Make sure you discuss any questions you have with your health care provider. Document Released: 11/27/2003 Document Revised: 01/07/2016 Document Reviewed: 06/26/2015 Elsevier Interactive Patient Education  2017 [place] Anesthesia is a term that refers to techniques, procedures, and medicines that help a person stay safe and comfortable during a medical procedure. Monitored anesthesia care, or sedation, is one type of anesthesia. Your anesthesia specialist may recommend sedation if you will be having a procedure that does not require you to be unconscious, such as:  Cataract surgery.  A dental procedure.  A biopsy.  A colonoscopy. During the procedure, you may receive a medicine to help you relax (sedative). There are three levels of sedation:  Mild sedation. At this level, you may feel awake and relaxed. You will be able to follow directions.  Moderate sedation. At this level, you will be sleepy. You may not remember the procedure.  Deep sedation. At this level, you will be asleep. You will  not remember the procedure. The more medicine you are given, the deeper your level of sedation will be. Depending on how you respond to the procedure, the anesthesia specialist may change your level of sedation or the type of anesthesia to fit your needs. An anesthesia specialist will monitor you closely during the procedure. Let your health care provider know about:  Any allergies you have.  All medicines you are taking, including vitamins, herbs, eye drops, creams, and over-the-counter medicines.  Any  use of steroids (by mouth or as a cream).  Any problems you or family members have had with sedatives and anesthetic medicines.  Any blood disorders you have.  Any surgeries you have had.  Any medical conditions you have, such as sleep apnea.  Whether you are pregnant or may be pregnant.  Any use of cigarettes, alcohol, or street drugs. What are the risks? Generally, this is a safe procedure. However, problems may occur, including:  Getting too much medicine (oversedation).  Nausea.  Allergic reaction to medicines.  Trouble breathing. If this happens, a breathing tube may be used to help with breathing. It will be removed when you are awake and breathing on your own.  Heart trouble.  Lung trouble. Before the procedure Staying hydrated  Follow instructions from your health care provider about hydration, which may include:  Up to 2 hours before the procedure - you may continue to drink clear liquids, such as water, clear fruit juice, black coffee, and plain tea. Eating and drinking restrictions  Follow instructions from your health care provider about eating and drinking, which may include:  8 hours before the procedure - stop eating heavy meals or foods such as meat, fried foods, or fatty foods.  6 hours before the procedure - stop eating light meals or foods, such as toast or cereal.  6 hours before the procedure - stop drinking milk or drinks that contain milk.  2  hours before the procedure - stop drinking clear liquids. Medicines  Ask your health care provider about:  Changing or stopping your regular medicines. This is especially important if you are taking diabetes medicines or blood thinners.  Taking medicines such as aspirin and ibuprofen. These medicines can thin your blood. Do not take these medicines before your procedure if your health care provider instructs you not to. Tests and exams  You will have a physical exam.  You may have blood tests done to show:  How well your kidneys and liver are working.  How well your blood can clot.  General instructions  Plan to have someone take you home from the hospital or clinic.  If you will be going home right after the procedure, plan to have someone with you for 24 hours. What happens during the procedure?  Your blood pressure, heart rate, breathing, level of pain and overall condition will be monitored.  An IV tube will be inserted into one of your veins.  Your anesthesia specialist will give you medicines as needed to keep you comfortable during the procedure. This may mean changing the level of sedation.  The procedure will be performed. After the procedure  Your blood pressure, heart rate, breathing rate, and blood oxygen level will be monitored until the medicines you were given have worn off.  Do not drive for 24 hours if you received a sedative.  You may:  Feel sleepy, clumsy, or nauseous.  Feel forgetful about what happened after the procedure.  Have a sore throat if you had a breathing tube during the procedure.  Vomit. This information is not intended to replace advice given to you by your health care provider. Make sure you discuss any questions you have with your health care provider. Document Released: 01/08/2005 Document Revised: 09/21/2015 Document Reviewed: 08/05/2015 Elsevier Interactive Patient Education  2017 [place] POST-ANESTHESIA  IMMEDIATELY FOLLOWING SURGERY:  Do not drive or operate machinery for the first twenty four hours after surgery.  Do not make any important decisions for  twenty four hours after surgery or while taking narcotic pain medications or sedatives.  If you develop intractable nausea and vomiting or a severe headache please notify your doctor immediately.  FOLLOW-UP:  Please make an appointment with your surgeon as instructed. You do not need to follow up with anesthesia unless specifically instructed to do so.  WOUND CARE INSTRUCTIONS (if applicable):  Keep a dry clean dressing on the anesthesia/puncture wound site if there is drainage.  Once the wound has quit draining you may leave it open to air.  Generally you should leave the bandage intact for twenty four hours unless there is drainage.  If the epidural site drains for more than 36-48 hours please call the anesthesia department.  QUESTIONS?:  Please feel free to call your physician or the hospital operator if you have any questions, and they will be happy to assist you.

## 2016-03-19 ENCOUNTER — Encounter (HOSPITAL_COMMUNITY): Payer: Self-pay

## 2016-03-19 ENCOUNTER — Encounter (HOSPITAL_COMMUNITY)
Admission: RE | Admit: 2016-03-19 | Discharge: 2016-03-19 | Disposition: A | Discharge status: Home / Self Care | Payer: Medicaid Other | Source: Ambulatory Visit | Attending: Gastroenterology | Admitting: Gastroenterology

## 2016-03-19 DIAGNOSIS — K625 Hemorrhage of anus and rectum: Secondary | ICD-10-CM | POA: Diagnosis not present

## 2016-03-19 DIAGNOSIS — Z01812 Encounter for preprocedural laboratory examination: Secondary | ICD-10-CM | POA: Diagnosis present

## 2016-03-19 DIAGNOSIS — Z86010 Personal history of colonic polyps: Secondary | ICD-10-CM | POA: Insufficient documentation

## 2016-03-19 HISTORY — DX: Unspecified osteoarthritis, unspecified site: M19.90

## 2016-03-19 HISTORY — DX: Unspecified glaucoma: H40.9

## 2016-03-19 HISTORY — DX: Anxiety disorder, unspecified: F41.9

## 2016-03-19 LAB — BASIC METABOLIC PANEL
ANION GAP: 7 (ref 5–15)
BUN: 16 mg/dL (ref 6–20)
CHLORIDE: 105 mmol/L (ref 101–111)
CO2: 27 mmol/L (ref 22–32)
Calcium: 10.2 mg/dL (ref 8.9–10.3)
Creatinine, Ser: 0.53 mg/dL (ref 0.44–1.00)
GFR calc Af Amer: >60 mL/min (ref >60)
GFR calc non Af Amer: >60 mL/min (ref >60)
GLUCOSE: 97 mg/dL (ref 65–99)
POTASSIUM: 3.6 mmol/L (ref 3.5–5.1)
SODIUM: 139 mmol/L (ref 135–145)

## 2016-03-19 LAB — CBC WITH DIFFERENTIAL/PLATELET
BASOS ABS: 0 10*3/uL (ref 0.0–0.1)
Basophils Relative: 0 %
Eosinophils Absolute: 0.2 10*3/uL (ref 0.0–0.7)
Eosinophils Relative: 3 %
HCT: 39.9 % (ref 36.0–46.0)
HEMOGLOBIN: 13.1 g/dL (ref 12.0–15.0)
LYMPHS PCT: 55 %
Lymphs Abs: 3.4 10*3/uL (ref 0.7–4.0)
MCH: 28.1 pg (ref 26.0–34.0)
MCHC: 32.8 g/dL (ref 30.0–36.0)
MCV: 85.6 fL (ref 78.0–100.0)
Monocytes Absolute: 0.3 10*3/uL (ref 0.1–1.0)
Monocytes Relative: 5 %
NEUTROS PCT: 37 %
Neutro Abs: 2.2 10*3/uL (ref 1.7–7.7)
Platelets: 295 10*3/uL (ref 150–400)
RBC: 4.66 MIL/uL (ref 3.87–5.11)
RDW: 14.3 % (ref 11.5–15.5)
WBC: 6.1 10*3/uL (ref 4.0–10.5)

## 2016-03-24 ENCOUNTER — Other Ambulatory Visit: Payer: Self-pay

## 2016-03-24 DIAGNOSIS — R9431 Abnormal electrocardiogram [ECG] [EKG]: Secondary | ICD-10-CM

## 2016-03-24 NOTE — Progress Notes (Signed)
PT is aware.

## 2016-03-24 NOTE — Progress Notes (Signed)
cc'd to pcp 

## 2016-03-24 NOTE — Progress Notes (Signed)
Pt is aware.  

## 2016-03-25 ENCOUNTER — Ambulatory Visit (HOSPITAL_COMMUNITY)
Admission: RE | Admit: 2016-03-25 | Discharge: 2016-03-25 | Disposition: A | Discharge status: Home / Self Care | Payer: Medicaid Other | Source: Ambulatory Visit | Attending: Gastroenterology | Admitting: Gastroenterology

## 2016-03-25 ENCOUNTER — Encounter (HOSPITAL_COMMUNITY): Payer: Self-pay

## 2016-03-25 ENCOUNTER — Encounter (HOSPITAL_COMMUNITY)
Admission: RE | Disposition: A | Discharge status: Home / Self Care | Payer: Self-pay | Source: Ambulatory Visit | Attending: Gastroenterology

## 2016-03-25 ENCOUNTER — Ambulatory Visit (HOSPITAL_COMMUNITY): Payer: Medicaid Other | Admitting: Anesthesiology

## 2016-03-25 DIAGNOSIS — M1990 Unspecified osteoarthritis, unspecified site: Secondary | ICD-10-CM | POA: Diagnosis not present

## 2016-03-25 DIAGNOSIS — J45909 Unspecified asthma, uncomplicated: Secondary | ICD-10-CM | POA: Insufficient documentation

## 2016-03-25 DIAGNOSIS — Z888 Allergy status to other drugs, medicaments and biological substances status: Secondary | ICD-10-CM | POA: Diagnosis not present

## 2016-03-25 DIAGNOSIS — E559 Vitamin D deficiency, unspecified: Secondary | ICD-10-CM | POA: Diagnosis not present

## 2016-03-25 DIAGNOSIS — K648 Other hemorrhoids: Secondary | ICD-10-CM | POA: Insufficient documentation

## 2016-03-25 DIAGNOSIS — K625 Hemorrhage of anus and rectum: Secondary | ICD-10-CM

## 2016-03-25 DIAGNOSIS — H409 Unspecified glaucoma: Secondary | ICD-10-CM | POA: Diagnosis not present

## 2016-03-25 DIAGNOSIS — K921 Melena: Secondary | ICD-10-CM | POA: Diagnosis not present

## 2016-03-25 DIAGNOSIS — F17210 Nicotine dependence, cigarettes, uncomplicated: Secondary | ICD-10-CM | POA: Diagnosis not present

## 2016-03-25 DIAGNOSIS — Z86010 Personal history of colonic polyps: Secondary | ICD-10-CM | POA: Insufficient documentation

## 2016-03-25 DIAGNOSIS — I10 Essential (primary) hypertension: Secondary | ICD-10-CM | POA: Diagnosis not present

## 2016-03-25 DIAGNOSIS — F419 Anxiety disorder, unspecified: Secondary | ICD-10-CM | POA: Insufficient documentation

## 2016-03-25 DIAGNOSIS — G8929 Other chronic pain: Secondary | ICD-10-CM | POA: Diagnosis not present

## 2016-03-25 DIAGNOSIS — M542 Cervicalgia: Secondary | ICD-10-CM | POA: Insufficient documentation

## 2016-03-25 DIAGNOSIS — D125 Benign neoplasm of sigmoid colon: Secondary | ICD-10-CM | POA: Insufficient documentation

## 2016-03-25 DIAGNOSIS — D123 Benign neoplasm of transverse colon: Secondary | ICD-10-CM | POA: Insufficient documentation

## 2016-03-25 DIAGNOSIS — D124 Benign neoplasm of descending colon: Secondary | ICD-10-CM | POA: Insufficient documentation

## 2016-03-25 DIAGNOSIS — K644 Residual hemorrhoidal skin tags: Secondary | ICD-10-CM | POA: Insufficient documentation

## 2016-03-25 HISTORY — PX: POLYPECTOMY: SHX5525

## 2016-03-25 HISTORY — PX: COLONOSCOPY WITH PROPOFOL: SHX5780

## 2016-03-25 SURGERY — COLONOSCOPY WITH PROPOFOL
Anesthesia: Monitor Anesthesia Care

## 2016-03-25 MED ORDER — EPHEDRINE SULFATE 50 MG/ML IJ SOLN
INTRAMUSCULAR | Status: DC | PRN
  Administered 2016-03-25: 10 mg via INTRAVENOUS

## 2016-03-25 MED ORDER — LACTATED RINGERS IV SOLN
INTRAVENOUS | Status: DC
  Administered 2016-03-25 (×2): via INTRAVENOUS

## 2016-03-25 MED ORDER — MIDAZOLAM HCL 5 MG/5ML IJ SOLN
INTRAMUSCULAR | Status: DC | PRN
  Administered 2016-03-25: 2 mg via INTRAVENOUS

## 2016-03-25 MED ORDER — FENTANYL CITRATE (PF) 100 MCG/2ML IJ SOLN
25.0000 ug | INTRAMUSCULAR | Status: DC | PRN
  Administered 2016-03-25: 25 ug via INTRAVENOUS

## 2016-03-25 MED ORDER — MIDAZOLAM HCL 2 MG/2ML IJ SOLN
INTRAMUSCULAR | Status: AC
  Filled 2016-03-25: qty 2

## 2016-03-25 MED ORDER — GLYCOPYRROLATE 0.2 MG/ML IJ SOLN
INTRAMUSCULAR | Status: AC
  Filled 2016-03-25: qty 1

## 2016-03-25 MED ORDER — GLYCOPYRROLATE 0.2 MG/ML IJ SOLN
0.2000 mg | Freq: Once | INTRAMUSCULAR | Status: AC | PRN
  Administered 2016-03-25: 0.2 mg via INTRAVENOUS

## 2016-03-25 MED ORDER — FENTANYL CITRATE (PF) 100 MCG/2ML IJ SOLN
INTRAMUSCULAR | Status: AC
  Filled 2016-03-25: qty 2

## 2016-03-25 MED ORDER — PROPOFOL 500 MG/50ML IV EMUL
INTRAVENOUS | Status: DC | PRN
  Administered 2016-03-25: 12:00:00 via INTRAVENOUS
  Administered 2016-03-25: 125 ug/kg/min via INTRAVENOUS

## 2016-03-25 MED ORDER — MIDAZOLAM HCL 2 MG/2ML IJ SOLN
1.0000 mg | INTRAMUSCULAR | Status: DC | PRN
  Administered 2016-03-25: 2 mg via INTRAVENOUS

## 2016-03-25 MED ORDER — CHLORHEXIDINE GLUCONATE CLOTH 2 % EX PADS: 6.0000 | MEDICATED_PAD | Freq: Once | CUTANEOUS | Status: DC

## 2016-03-25 MED ORDER — CHLORHEXIDINE GLUCONATE CLOTH 2 % EX PADS
6.0000 | MEDICATED_PAD | Freq: Once | CUTANEOUS | Status: DC
Start: 2016-03-25 — End: 2016-03-25

## 2016-03-25 NOTE — Transfer of Care (Signed)
Immediate Anesthesia Transfer of Care Note  Patient: [name]  Procedure(s) Performed: Procedure(s) with comments: COLONOSCOPY WITH PROPOFOL (N/A) - 1100 POLYPECTOMY - colon  Patient Location: PACU  Anesthesia Type:MAC  Level of Consciousness: awake, alert , oriented and patient cooperative  Airway & Oxygen Therapy: Patient Spontanous Breathing and Patient connected to nasal cannula oxygen  Post-op Assessment: Report given to RN and Post -op Vital signs reviewed and stable  Post vital signs: Reviewed and stable  Last Vitals:  Vitals:   03/25/16 1040 03/25/16 1045  BP: 120/73 126/78  Pulse:    Resp: 18 16  Temp:      Last Pain:  Vitals:   03/25/16 1036  TempSrc: Oral         Complications: No apparent anesthesia complications

## 2016-03-25 NOTE — Discharge Instructions (Signed)
You had 3 polyps removed. You have LARGE INTERNAL hemorrhoids, CAUSING RECTAL BLEEDING. YOU HAVE LARGE EXTERNAL HEMORRHOIDS. THE LAST PART OF YOUR SMALL BOWEL IS NORMAL.    CONTINUE YOUR WEIGHT LOSS EFFORTS. LOSE TEN POUNDS.  DRINK WATER TO KEEP YOUR URINE LIGHT YELLOW.  FOLLOW A HIGH FIBER DIET. AVOID ITEMS THAT CAUSE BLOATING & GAS. SEE INFO BELOW.  USE PREPARATION H FOUR TIMES  A DAY IF NEEDED TO RELIEVE RECTAL PAIN/PRESSURE/BLEEDING.  Call if you want a referral to see surgery to fix your hemorrhoids.  YOUR BIOPSY RESULTS WILL BE AVAILABLE IN MY CHART AFTER DEC 1 AND MY OFFICE WILL CONTACT YOU IN 10-14 DAYS WITH YOUR RESULTS.   Next colonoscopy in 5-10 years.    Colonoscopy Care After Read the instructions outlined below and refer to this sheet in the next week. These discharge instructions provide you with general information on caring for yourself after you leave the hospital. While your treatment has been planned according to the most current medical practices available, unavoidable complications occasionally occur. If you have any problems or questions after discharge, call DR. Jamie Belger, [phone].  ACTIVITY  You may resume your regular activity, but move at a slower pace for the next 24 hours.   Take frequent rest periods for the next 24 hours.   Walking will help get rid of the air and reduce the bloated feeling in your belly (abdomen).   No driving for 24 hours (because of the medicine (anesthesia) used during the test).   You may shower.   Do not sign any important legal documents or operate any machinery for 24 hours (because of the anesthesia used during the test).    NUTRITION  Drink plenty of fluids.   You may resume your normal diet as instructed by your doctor.   Begin with a light meal and progress to your normal diet. Heavy or fried foods are harder to digest and may make you feel sick to your stomach (nauseated).   Avoid alcoholic beverages for 24  hours or as instructed.    MEDICATIONS  You may resume your normal medications.   WHAT YOU CAN EXPECT TODAY  Some feelings of bloating in the abdomen.   Passage of more gas than usual.   Spotting of blood in your stool or on the toilet paper  .  IF YOU HAD POLYPS REMOVED DURING THE COLONOSCOPY:  Eat a soft diet IF YOU HAVE NAUSEA, BLOATING, ABDOMINAL PAIN, OR VOMITING.    FINDING OUT THE RESULTS OF YOUR TEST Not all test results are available during your visit. DR. Oneida Alar WILL CALL YOU WITHIN 14 DAYS OF YOUR PROCEDUE WITH YOUR RESULTS. Do not assume everything is normal if you have not heard from DR. Jameil Whitmoyer, CALL HER OFFICE AT [phone].  SEEK IMMEDIATE MEDICAL ATTENTION AND CALL THE OFFICE: [phone] IF:  You have more than a spotting of blood in your stool.   Your belly is swollen (abdominal distention).   You are nauseated or vomiting.   You have a temperature over 101F.   You have abdominal pain or discomfort that is severe or gets worse throughout the day.   High-Fiber Diet A high-fiber diet changes your normal diet to include more whole grains, legumes, fruits, and vegetables. Changes in the diet involve replacing refined carbohydrates with unrefined foods. The calorie level of the diet is essentially unchanged. The Dietary Reference Intake (recommended amount) for adult males is 38 grams per day. For adult females, it is 25 grams  per day. Pregnant and lactating women should consume 28 grams of fiber per day. Fiber is the intact part of a plant that is not broken down during digestion. Functional fiber is fiber that has been isolated from the plant to provide a beneficial effect in the body. PURPOSE  Increase stool bulk.   Ease and regulate bowel movements.   Lower cholesterol.   REDUCE RISK OF COLON CANCER  INDICATIONS THAT YOU NEED MORE FIBER  Constipation and hemorrhoids.   Uncomplicated diverticulosis (intestine condition) and irritable bowel  syndrome.   Weight management.   As a protective measure against hardening of the arteries (atherosclerosis), diabetes, and cancer.   GUIDELINES FOR INCREASING FIBER IN THE DIET  Start adding fiber to the diet slowly. A gradual increase of about 5 more grams (2 slices of whole-wheat bread, 2 servings of most fruits or vegetables, or 1 bowl of high-fiber cereal) per day is best. Too rapid an increase in fiber may result in constipation, flatulence, and bloating.   Drink enough water and fluids to keep your urine clear or pale yellow. Water, juice, or caffeine-free drinks are recommended. Not drinking enough fluid may cause constipation.   Eat a variety of high-fiber foods rather than one type of fiber.   Try to increase your intake of fiber through using high-fiber foods rather than fiber pills or supplements that contain small amounts of fiber.   The goal is to change the types of food eaten. Do not supplement your present diet with high-fiber foods, but replace foods in your present diet.   INCLUDE A VARIETY OF FIBER SOURCES  Replace refined and processed grains with whole grains, canned fruits with fresh fruits, and incorporate other fiber sources. White rice, white breads, and most bakery goods contain little or no fiber.   Brown whole-grain rice, buckwheat oats, and many fruits and vegetables are all good sources of fiber. These include: broccoli, Brussels sprouts, cabbage, cauliflower, beets, sweet potatoes, white potatoes (skin on), carrots, tomatoes, eggplant, squash, berries, fresh fruits, and dried fruits.   Cereals appear to be the richest source of fiber. Cereal fiber is found in whole grains and bran. Bran is the fiber-rich outer coat of cereal grain, which is largely removed in refining. In whole-grain cereals, the bran remains. In breakfast cereals, the largest amount of fiber is found in those with "bran" in their names. The fiber content is sometimes indicated on the label.     You may need to include additional fruits and vegetables each day.   In baking, for 1 cup white flour, you may use the following substitutions:   1 cup whole-wheat flour minus 2 tablespoons.   1/2 cup white flour plus 1/2 cup whole-wheat flour.   Polyps, Colon  A polyp is extra tissue that grows inside your body. Colon polyps grow in the large intestine. The large intestine, also called the colon, is part of your digestive system. It is a long, hollow tube at the end of your digestive tract where your body makes and stores stool. Most polyps are not dangerous. They are benign. This means they are not cancerous. But over time, some types of polyps can turn into cancer. Polyps that are smaller than a pea are usually not harmful. But larger polyps could someday become or may already be cancerous. To be safe, doctors remove all polyps and test them.    PREVENTION There is not one sure way to prevent polyps. You might be able to lower your risk  of getting them if you:  Eat more fruits and vegetables and less fatty food.   Do not smoke.   Avoid alcohol.   Exercise every day.   Lose weight if you are overweight.   Eating more calcium and folate can also lower your risk of getting polyps. Some foods that are rich in calcium are milk, cheese, and broccoli. Some foods that are rich in folate are chickpeas, kidney beans, and spinach.   Hemorrhoids Hemorrhoids are dilated (enlarged) veins around the rectum. Sometimes clots will form in the veins. This makes them swollen and painful. These are called thrombosed hemorrhoids. Causes of hemorrhoids include:  Constipation.   Straining to have a bowel movement.   HEAVY LIFTING  HOME CARE INSTRUCTIONS  Eat a well balanced diet and drink 6 to 8 glasses of water every day to avoid constipation. You may also use a bulk laxative.   Avoid straining to have bowel movements.   Keep anal area dry and clean.   Do not use a donut shaped pillow  or sit on the toilet for long periods. This increases blood pooling and pain.   Move your bowels when your body has the urge; this will require less straining and will decrease pain and pressure.

## 2016-03-25 NOTE — Anesthesia Postprocedure Evaluation (Signed)
Anesthesia Post Note  Patient: [name]  Procedure(s) Performed: Procedure(s) (LRB): COLONOSCOPY WITH PROPOFOL (N/Doyle) POLYPECTOMY  Patient location during evaluation: PACU Anesthesia Type: MAC Level of consciousness: awake and alert and oriented Pain management: pain level controlled Vital Signs Assessment: post-procedure vital signs reviewed and stable Respiratory status: spontaneous breathing Cardiovascular status: stable Postop Assessment: no signs of nausea or vomiting Anesthetic complications: no    Last Vitals:  Vitals:   03/25/16 1040 03/25/16 1045  BP: 120/73 126/78  Pulse:    Resp: 18 16  Temp:      Last Pain:  Vitals:   03/25/16 1036  TempSrc: Oral                 [name]
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
## 2016-03-25 NOTE — Op Note (Signed)
[facility] Patient Name: [name] Procedure Date: 03/25/2016 11:47 AM MRN: [id number] Date of Birth: February 10, 1956 Attending MD: Barney Drain , MD CSN: [id number] Age: 60 Admit Type: Outpatient Procedure:                Colonoscopy Indications:              Hematochezia Providers:                Barney Drain, MD, Janeece Riggers, RN, Purcell Nails [place],                            [employer] officer Referring MD:             Arcola Jansky. Cobb Medicines:                Propofol per Anesthesia Complications:            No immediate complications. Estimated Blood Loss:     Estimated blood loss was minimal. Procedure:                Pre-Anesthesia Assessment:                           - Prior to the procedure, a History and Physical                            was performed, and patient medications and                            allergies were reviewed. The patient's tolerance of                            previous anesthesia was also reviewed. The risks                            and benefits of the procedure and the sedation                            options and risks were discussed with the patient.                            All questions were answered, and informed consent                            was obtained. Prior Anticoagulants: The patient has                            taken previous NSAID medication. ASA Grade                            Assessment: II - A patient with mild systemic                            disease. After reviewing the risks and benefits,  the patient was deemed in satisfactory condition to                            undergo the procedure. After obtaining informed                            consent, the colonoscope was passed under direct                            vision. Throughout the procedure, the patient's                            blood pressure, pulse, and oxygen saturations were                            monitored continuously.  The [id number]                            scope was introduced through the anus and advanced                            to the 15 cm into the ileum. The terminal ileum,                            ileocecal valve, appendiceal orifice, and rectum                            were photographed. The colonoscopy was somewhat                            difficult due to a tortuous colon. Successful                            completion of the procedure was aided by COLOWRAP.                            The patient tolerated the procedure well. The                            quality of the bowel preparation was good. Scope In: 12:02:30 PM Scope Out: 12:21:58 PM Scope Withdrawal Time: 0 hours 17 minutes 17 seconds  Total Procedure Duration: 0 hours 19 minutes 28 seconds  Findings:      The digital rectal exam findings include non-thrombosed external       hemorrhoids.      Two sessile polyps were found in the distal transverse colon and hepatic       flexure. The polyps were 5 to 7 mm in size. These polyps were removed       with a hot snare. Resection and retrieval were complete.      A 4 mm polyp was found in the sigmoid colon. The polyp was sessile. The       polyp was removed with a cold biopsy forceps. Resection and retrieval       were complete.  Bleeding internal hemorrhoids were found. The hemorrhoids were large. Impression:               - Non-thrombosed external hemorrhoids found on                            digital rectal exam.                           - Two 5 to 7 mm polyps in the distal transverse                            colon and at the hepatic flexure, removed with a                            hot snare. Resected and retrieved.                           - One 4 mm polyp in the sigmoid colon, removed with                            a cold biopsy forceps. Resected and retrieved.                           - rRECTAL Bleeding DUE TO internal hemorrhoids. Moderate  Sedation:      Per Anesthesia Care Recommendation:           - High fiber diet.                           - Continue present medications. CALL FOR SURGERY                            REFERRAL IF NEEDED.                           - Await pathology results.                           - Repeat colonoscopy in 5-10 years for surveillance.                           - Patient has a contact number available for                            emergencies. The signs and symptoms of potential                            delayed complications were discussed with the                            patient. Return to normal activities tomorrow.                            Written discharge instructions were provided to the  patient. Procedure Code(s):        --- Professional ---                           [phone], Colonoscopy, flexible; with removal of                            tumor(s), polyp(s), or other lesion(s) by snare                            technique                           45380, 9, Colonoscopy, flexible; with biopsy,                            single or multiple Diagnosis Code(s):        --- Professional ---                           D12.3, Benign neoplasm of transverse colon (hepatic                            flexure or splenic flexure)                           D12.5, Benign neoplasm of sigmoid colon                           K64.4, Residual hemorrhoidal skin tags                           K64.8, Other hemorrhoids                           K92.1, Melena (includes Hematochezia) CPT copyright 2016 American Medical Association. All rights reserved. The codes documented in this report are preliminary and upon coder review may  be revised to meet current compliance requirements. Barney Drain, MD Barney Drain, MD 03/25/2016 1:02:02 PM This report has been signed electronically. Number of Addenda: 0

## 2016-03-25 NOTE — Anesthesia Preprocedure Evaluation (Signed)
Anesthesia Evaluation  Patient identified by MRN, date of birth, ID band Patient awake    Reviewed: Allergy & Precautions, NPO status , Patient's Chart, lab work & pertinent test results  Airway Mallampati: III  TM Distance: >3 FB     Dental  (+) Poor Dentition, Missing   Pulmonary asthma , Current Smoker,    breath sounds clear to auscultation       Cardiovascular hypertension, Pt. on medications  Rhythm:Regular Rate:Normal     Neuro/Psych Anxiety    GI/Hepatic negative GI ROS,   Endo/Other    Renal/GU      Musculoskeletal   Abdominal   Peds  Hematology   Anesthesia Other Findings   Reproductive/Obstetrics                             Anesthesia Physical Anesthesia Plan  ASA: II  Anesthesia Plan: MAC   Post-op Pain Management:    Induction: Intravenous  Airway Management Planned: Simple Face Mask  Additional Equipment:   Intra-op Plan:   Post-operative Plan:   Informed Consent: I have reviewed the patients History and Physical, chart, labs and discussed the procedure including the risks, benefits and alternatives for the proposed anesthesia with the patient or authorized representative who has indicated his/her understanding and acceptance.     Plan Discussed with:   Anesthesia Plan Comments:         Anesthesia Quick Evaluation

## 2016-03-27 ENCOUNTER — Encounter (HOSPITAL_COMMUNITY): Payer: Self-pay | Admitting: Gastroenterology

## 2016-03-31 ENCOUNTER — Telehealth: Payer: Self-pay | Admitting: Gastroenterology

## 2016-03-31 NOTE — Telephone Encounter (Signed)
Please call pt. She had THREE simple adenomas removed.   CONTINUE YOUR WEIGHT LOSS EFFORTS. LOSE TEN POUNDS.  DRINK WATER TO KEEP YOUR URINE LIGHT YELLOW.  FOLLOW A HIGH FIBER DIET. AVOID ITEMS THAT CAUSE BLOATING & GAS.   USE PREPARATION H FOUR TIMES  A DAY IF NEEDED TO RELIEVE RECTAL PAIN/PRESSURE/BLEEDING.  Call if you want a referral to see surgery to fix your hemorrhoids.  Next colonoscopy in 3 YEARS NOT 5-10 years.

## 2016-03-31 NOTE — Telephone Encounter (Signed)
LMOM to call back

## 2016-04-02 ENCOUNTER — Telehealth: Payer: Self-pay | Admitting: Gastroenterology

## 2016-04-02 NOTE — Telephone Encounter (Signed)
Pt is aware and she will call if she wants to see a surgery

## 2016-04-02 NOTE — Telephone Encounter (Signed)
Pt is aware see other phone note

## 2016-04-02 NOTE — Telephone Encounter (Signed)
Reminder in epic °

## 2016-04-02 NOTE — Telephone Encounter (Signed)
Pt was returning a call to GF from yesterday. Please call [phone]
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
## 2016-04-30 ENCOUNTER — Encounter: Payer: Self-pay | Admitting: Internal Medicine

## 2016-04-30 ENCOUNTER — Ambulatory Visit (INDEPENDENT_AMBULATORY_CARE_PROVIDER_SITE_OTHER): Payer: Medicaid Other | Admitting: Internal Medicine

## 2016-04-30 VITALS — BP 124/72 | HR 61 | Ht 63.0 in | Wt 173.0 lb

## 2016-04-30 DIAGNOSIS — R9431 Abnormal electrocardiogram [ECG] [EKG]: Secondary | ICD-10-CM

## 2016-04-30 NOTE — Patient Instructions (Signed)
Your physician recommends that you schedule a follow-up appointment in: to be determined after test   Your physician has requested that you have an echocardiogram. Echocardiography is a painless test that uses sound waves to create images of your heart. It provides your doctor with information about the size and shape of your heart and how well your heart's chambers and valves are working. This procedure takes approximately one hour. There are no restrictions for this procedure.      Thank you for choosing [facility] !

## 2016-05-07 ENCOUNTER — Ambulatory Visit (HOSPITAL_COMMUNITY)
Admission: RE | Admit: 2016-05-07 | Discharge: 2016-05-07 | Disposition: A | Discharge status: Home / Self Care | Payer: Medicaid Other | Source: Ambulatory Visit | Attending: Internal Medicine | Admitting: Internal Medicine

## 2016-05-07 DIAGNOSIS — R9431 Abnormal electrocardiogram [ECG] [EKG]: Secondary | ICD-10-CM | POA: Diagnosis not present

## 2016-05-07 DIAGNOSIS — I071 Rheumatic tricuspid insufficiency: Secondary | ICD-10-CM | POA: Insufficient documentation

## 2016-05-07 LAB — ECHOCARDIOGRAM COMPLETE
CHL CUP STROKE VOLUME: 52 mL
E/e' ratio: 6.28
EWDT: 225 ms
FS: 35 % (ref 28–44)
IV/PV OW: 0.98
LA ID, A-P, ES: 32 mm
LA diam end sys: 32 mm
LA diam index: 1.69 cm/m2
LA vol A4C: 38.8 ml
LA vol index: 18.3 mL/m2
LAVOL: 34.7 mL
LDCA: 2.84 cm2
LV E/e' medial: 6.28
LV E/e'average: 6.28
LV SIMPSON'S DISK: 62
LV dias vol: 85 mL (ref 46–106)
LV e' LATERAL: 11.9 cm/s
LVDIAVOLIN: 45 mL/m2
LVOT SV: 74 mL
LVOT VTI: 26 cm
LVOT peak grad rest: 5 mmHg
LVOTD: 19 mm
LVOTPV: 113 cm/s
LVSYSVOL: 33 mL (ref 14–42)
LVSYSVOLIN: 17 mL/m2
MV Dec: 225
MV Peak grad: 2 mmHg
MV pk A vel: 42.8 m/s
MV pk E vel: 74.7 m/s
PW: 10 mm — AB (ref 0.6–1.1)
RV LATERAL S' VELOCITY: 13.3 cm/s
RV TAPSE: 26.1 mm
RV sys press: 26 mmHg
Reg peak vel: 242 cm/s
TDI e' lateral: 11.9
TDI e' medial: 7.72
TR max vel: 242 cm/s

## 2016-05-07 NOTE — Progress Notes (Signed)
*  PRELIMINARY RESULTS* Echocardiogram 2D Echocardiogram has been performed.  [name] 05/07/2016, 10:53 AM

## 2016-05-09 ENCOUNTER — Telehealth: Payer: Self-pay

## 2016-05-09 DIAGNOSIS — R9431 Abnormal electrocardiogram [ECG] [EKG]: Secondary | ICD-10-CM

## 2016-05-09 NOTE — Telephone Encounter (Signed)
Order placed,pt aware,to terry to schedule

## 2016-05-09 NOTE — Telephone Encounter (Signed)
-----   Message from Fay Records, MD sent at 05/08/2016  5:17 PM EST ----- Echo shows normal wall thickness, normal pumping function of the heart   There is mild tricusp insufficnecy   Echo does not explain EKG changes    would recomm Dobutamine myoview to r/o inducible ischemia

## 2016-05-16 ENCOUNTER — Inpatient Hospital Stay (HOSPITAL_COMMUNITY): Admission: RE | Admit: 2016-05-16 | Payer: Medicaid Other | Source: Ambulatory Visit

## 2016-05-16 ENCOUNTER — Other Ambulatory Visit (HOSPITAL_COMMUNITY): Payer: Medicaid Other

## 2016-05-27 ENCOUNTER — Encounter (HOSPITAL_COMMUNITY): Payer: Medicaid Other

## 2016-05-27 ENCOUNTER — Encounter (HOSPITAL_COMMUNITY): Payer: No Typology Code available for payment source

## 2016-05-28 ENCOUNTER — Inpatient Hospital Stay (HOSPITAL_COMMUNITY): Admission: RE | Admit: 2016-05-28 | Payer: Medicaid Other | Source: Ambulatory Visit

## 2016-05-28 ENCOUNTER — Encounter (HOSPITAL_COMMUNITY): Payer: Medicaid Other

## 2016-06-06 ENCOUNTER — Encounter (HOSPITAL_COMMUNITY)
Admission: RE | Admit: 2016-06-06 | Discharge: 2016-06-06 | Disposition: A | Discharge status: Home / Self Care | Payer: Medicaid Other | Source: Ambulatory Visit | Attending: Internal Medicine | Admitting: Internal Medicine

## 2016-06-06 ENCOUNTER — Inpatient Hospital Stay (HOSPITAL_COMMUNITY): Admission: RE | Admit: 2016-06-06 | Payer: Medicaid Other | Source: Ambulatory Visit

## 2016-06-06 NOTE — CV Procedure (Signed)
Patient did not show for dobutamine stress MPI. This is the 3rd time she has not come for scheduled test.

## 2016-08-20 ENCOUNTER — Ambulatory Visit (HOSPITAL_COMMUNITY)
Admission: RE | Admit: 2016-08-20 | Discharge: 2016-08-20 | Disposition: A | Discharge status: Home / Self Care | Payer: Medicaid Other | Source: Ambulatory Visit | Attending: Internal Medicine | Admitting: Internal Medicine

## 2016-08-20 ENCOUNTER — Encounter (HOSPITAL_COMMUNITY)
Admission: RE | Admit: 2016-08-20 | Discharge: 2016-08-20 | Disposition: A | Discharge status: Home / Self Care | Payer: Medicaid Other | Source: Ambulatory Visit | Attending: Internal Medicine | Admitting: Internal Medicine

## 2016-08-20 ENCOUNTER — Encounter (HOSPITAL_COMMUNITY): Payer: Self-pay

## 2016-08-20 DIAGNOSIS — R9431 Abnormal electrocardiogram [ECG] [EKG]: Secondary | ICD-10-CM | POA: Diagnosis not present

## 2016-08-20 LAB — NM MYOCAR MULTI W/SPECT W/WALL MOTION / EF
CHL CUP NUCLEAR SDS: 3
CSEPPHR: 142 {beats}/min
LVDIAVOL: 71 mL (ref 46–106)
LVSYSVOL: 35 mL
RATE: 0.46
Rest HR: 57 {beats}/min
SRS: 0
SSS: 3
TID: 1.1

## 2016-08-20 MED ORDER — TECHNETIUM TC 99M TETROFOSMIN IV KIT
10.0000 | PACK | Freq: Once | INTRAVENOUS | Status: AC | PRN
  Administered 2016-08-20: 12 via INTRAVENOUS

## 2016-08-20 MED ORDER — TECHNETIUM TC 99M TETROFOSMIN IV KIT
30.0000 | PACK | Freq: Once | INTRAVENOUS | Status: AC | PRN
  Administered 2016-08-20: 30 via INTRAVENOUS

## 2016-08-20 MED ORDER — SODIUM CHLORIDE 0.9% FLUSH
INTRAVENOUS | Status: AC
  Administered 2016-08-20: 10 mL via INTRAVENOUS
  Filled 2016-08-20: qty 10

## 2016-08-20 MED ORDER — REGADENOSON 0.4 MG/5ML IV SOLN
INTRAVENOUS | Status: AC
  Filled 2016-08-20: qty 5

## 2016-08-20 MED ORDER — DOBUTAMINE IN D5W 4-5 MG/ML-% IV SOLN
INTRAVENOUS | Status: AC
  Administered 2016-08-20: 10 ug via INTRAVENOUS
  Filled 2016-08-20: qty 250

## 2016-08-31 ENCOUNTER — Other Ambulatory Visit: Payer: Self-pay | Admitting: Gastroenterology

## 2017-03-21 ENCOUNTER — Other Ambulatory Visit: Payer: Self-pay | Admitting: Nurse Practitioner

## 2017-05-04 ENCOUNTER — Encounter (HOSPITAL_BASED_OUTPATIENT_CLINIC_OR_DEPARTMENT_OTHER): Payer: Self-pay

## 2017-05-04 DIAGNOSIS — R0683 Snoring: Secondary | ICD-10-CM

## 2017-05-04 DIAGNOSIS — R5383 Other fatigue: Secondary | ICD-10-CM

## 2017-05-04 DIAGNOSIS — G4733 Obstructive sleep apnea (adult) (pediatric): Secondary | ICD-10-CM

## 2017-05-04 DIAGNOSIS — G4700 Insomnia, unspecified: Secondary | ICD-10-CM

## 2017-05-04 DIAGNOSIS — G4710 Hypersomnia, unspecified: Secondary | ICD-10-CM

## 2017-05-23 ENCOUNTER — Ambulatory Visit (HOSPITAL_BASED_OUTPATIENT_CLINIC_OR_DEPARTMENT_OTHER): Payer: Medicaid Other | Attending: Nurse Practitioner | Admitting: Internal Medicine

## 2017-05-23 VITALS — Ht 61.0 in | Wt 176.0 lb

## 2017-05-23 DIAGNOSIS — R0683 Snoring: Secondary | ICD-10-CM

## 2017-05-23 DIAGNOSIS — G4700 Insomnia, unspecified: Secondary | ICD-10-CM

## 2017-05-23 DIAGNOSIS — G4710 Hypersomnia, unspecified: Secondary | ICD-10-CM

## 2017-05-23 DIAGNOSIS — G4719 Other hypersomnia: Secondary | ICD-10-CM | POA: Insufficient documentation

## 2017-05-23 DIAGNOSIS — G4733 Obstructive sleep apnea (adult) (pediatric): Secondary | ICD-10-CM | POA: Insufficient documentation

## 2017-05-23 DIAGNOSIS — R5383 Other fatigue: Secondary | ICD-10-CM | POA: Diagnosis not present

## 2017-05-23 DIAGNOSIS — G4730 Sleep apnea, unspecified: Secondary | ICD-10-CM | POA: Diagnosis present

## 2017-05-23 DIAGNOSIS — F518 Other sleep disorders not due to a substance or known physiological condition: Secondary | ICD-10-CM | POA: Insufficient documentation

## 2017-05-30 DIAGNOSIS — G4733 Obstructive sleep apnea (adult) (pediatric): Secondary | ICD-10-CM

## 2017-05-30 NOTE — Procedures (Signed)
Patient Name: [name], Steinmeyer Date: 05/23/2017 Gender: Female D.O.B: 20-May-1955 Age (years): 61 Referring Provider: Joyce Copa Height (inches): 16 Interpreting Physician: Baird Lyons MD, ABSM Weight (lbs): 176 RPSGT: Lanae Boast BMI: 33 MRN: [id number] Neck Size: 15.00 <br> <br> CLINICAL INFORMATION Sleep Study Type: NPSG Indication for sleep study: Excessive Daytime Sleepiness, Fatigue, OSA, Snoring, Witnessed Apneas  Epworth Sleepiness Score: 6  SLEEP STUDY TECHNIQUE As per the AASM Manual for the Scoring of Sleep and Associated Events v2.3 (April 2016) with a hypopnea requiring 4% desaturations.  The channels recorded and monitored were frontal, central and occipital EEG, electrooculogram (EOG), submentalis EMG (chin), nasal and oral airflow, thoracic and abdominal wall motion, anterior tibialis EMG, snore microphone, electrocardiogram, and pulse oximetry.  MEDICATIONS Medications self-administered by patient taken the night of the study : none reported  SLEEP ARCHITECTURE The study was initiated at 11:53:38 PM and ended at 6:07:32 AM.  Sleep onset time was 92.1 minutes and the sleep efficiency was 61.1%. The total sleep time was 228.3 minutes.  Stage REM latency was 136.0 minutes.  The patient spent 25.75% of the night in stage N1 sleep, 66.58% in stage N2 sleep, 0.00% in stage N3 and 7.67% in REM.  Alpha intrusion was absent.  Supine sleep was 97.15%.  RESPIRATORY PARAMETERS The overall apnea/hypopnea index (AHI) was 2.1 per hour. There were 5 total apneas, including 2 obstructive, 3 central and 0 mixed apneas. There were 3 hypopneas and 43 RERAs.  The AHI during Stage REM sleep was 10.3 per hour.  AHI while supine was 2.2 per hour.  The mean oxygen saturation was 96.20%. The minimum SpO2 during sleep was 93.00%.  moderate snoring was noted during this study.  CARDIAC DATA The 2 lead EKG demonstrated sinus rhythm. The mean heart rate  was 69.93 beats per minute. Other EKG findings include: None.  LEG MOVEMENT DATA The total PLMS were 0 with a resulting PLMS index of 0.00. Associated arousal with leg movement index was 0.0 .  IMPRESSIONS - No significant obstructive sleep apnea occurred during this study (AHI = 2.1/h). - No significant central sleep apnea occurred during this study (CAI = 0.8/h). - There were 43 RERAs (respiratory event- related arousals) not meeting duration or other criteria as apneas, but consistent with a diagnosis or Upper Airway Resistance Syndrome.  - The patient had minimal or no oxygen desaturation during the study (Min O2 = 93.00%) - The patient snored with moderate snoring volume. - No cardiac abnormalities were noted during this study. - Clinically significant periodic limb movements did not occur during sleep. No significant associated arousals. - Patient gave history of nocturnal choking, which should suggest possible reflux events during sleep.  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 - Consider possiblility of esophageal reflux causing sleep disturbance. - Be careful with alcohol, sedatives and other CNS depressants that may worsen sleep apnea and disrupt normal sleep architecture. - Sleep hygiene should be reviewed to assess factors that may improve sleep quality. - Weight management and regular exercise should be initiated or continued if appropriate.  [Electronically signed] 05/30/2017 04:35 PM  Baird Lyons MD, ABSM Diplomate, American Board of Sleep Medicine   NPI: [id number]                          [place] [place]  [facility]  ELECTRONICALLY SIGNED ON:  05/30/2017, 4:29 PM [place] PH: [phone]   FX: [phone] [place]

## 2017-06-25 ENCOUNTER — Other Ambulatory Visit: Payer: Self-pay | Admitting: Family Medicine

## 2017-06-25 DIAGNOSIS — Z1231 Encounter for screening mammogram for malignant neoplasm of breast: Secondary | ICD-10-CM

## 2017-09-10 ENCOUNTER — Telehealth: Payer: Self-pay | Admitting: Gastroenterology

## 2017-09-10 NOTE — Telephone Encounter (Signed)
Pt had LMOM on my VM that she wanted to schedule her colonoscopy. I called the patient back and told her per SF recommendations from her 2017 colonoscopy she is to have another one in 2020. She has questions about polyps and shouldn't she be seen sooner than every 5 yrs. I asked if she was having any GI problems and she said, no, she just uses preparation H every now and then. Please advise and call her back at [phone] [facility] if she isn't able to answer.

## 2017-09-10 NOTE — Telephone Encounter (Signed)
Left message on VM for a return call to discuss.

## 2017-09-14 NOTE — Telephone Encounter (Signed)
PT is aware.

## 2017-09-14 NOTE — Telephone Encounter (Signed)
PT is aware she is on Recall for 03/2019 and she said that is fine. She had last one in 03/2016. Said she is not having any problems and she just wanted to make sure when she is due.

## 2017-09-14 NOTE — Telephone Encounter (Signed)
REVIEWED. PLEASE CALL PT. I REVIEWED HER PATHOLOGY REPORT FROM Mar 25, 2016. SHE HAD THREE SMALL SIMPLE ADENOMAS REMOVED. HER NEXT COLONOSCOPY SHOULD BE IN AFTER Mar 26, 2019, WHICH WILL BE A THREE YEAR INTERVAL PER THE RECOMMENDATION OF OUR GI SOCIETY.

## 2017-09-15 ENCOUNTER — Ambulatory Visit (HOSPITAL_COMMUNITY)
Admission: RE | Admit: 2017-09-15 | Discharge: 2017-09-15 | Disposition: A | Discharge status: Home / Self Care | Payer: Medicaid Other | Source: Ambulatory Visit | Attending: Family Medicine | Admitting: Family Medicine

## 2017-09-15 ENCOUNTER — Other Ambulatory Visit (HOSPITAL_COMMUNITY): Payer: Self-pay | Admitting: Family Medicine

## 2017-09-15 DIAGNOSIS — R05 Cough: Secondary | ICD-10-CM

## 2017-09-15 DIAGNOSIS — R059 Cough, unspecified: Secondary | ICD-10-CM

## 2017-10-20 ENCOUNTER — Encounter: Payer: Self-pay | Admitting: *Deleted

## 2017-10-27 ENCOUNTER — Ambulatory Visit: Payer: Medicaid Other | Admitting: General Surgery

## 2017-10-27 ENCOUNTER — Encounter: Payer: Self-pay | Admitting: General Surgery

## 2017-10-27 VITALS — BP 122/80 | HR 96 | Resp 16 | Ht 61.0 in | Wt 168.0 lb

## 2017-10-27 DIAGNOSIS — D171 Benign lipomatous neoplasm of skin and subcutaneous tissue of trunk: Secondary | ICD-10-CM | POA: Diagnosis not present

## 2017-10-27 NOTE — Patient Instructions (Addendum)
The patient is aware to call back for any questions or concerns.  Follow up as needed or if symptoms worsen.
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
## 2017-10-28 DIAGNOSIS — D171 Benign lipomatous neoplasm of skin and subcutaneous tissue of trunk: Secondary | ICD-10-CM | POA: Insufficient documentation

## 2018-05-06 ENCOUNTER — Other Ambulatory Visit: Payer: Self-pay | Admitting: Gastroenterology

## 2018-05-06 NOTE — Telephone Encounter (Signed)
Called and informed pt.  

## 2018-07-19 ENCOUNTER — Other Ambulatory Visit: Payer: Self-pay | Admitting: Family Medicine

## 2018-07-19 DIAGNOSIS — R1013 Epigastric pain: Secondary | ICD-10-CM

## 2018-07-19 DIAGNOSIS — R1011 Right upper quadrant pain: Secondary | ICD-10-CM

## 2018-07-27 ENCOUNTER — Ambulatory Visit: Payer: Medicaid Other

## 2018-07-28 ENCOUNTER — Ambulatory Visit
Admission: RE | Admit: 2018-07-28 | Discharge: 2018-07-28 | Disposition: A | Discharge status: Home / Self Care | Payer: Medicaid Other | Source: Ambulatory Visit | Attending: Family Medicine | Admitting: Family Medicine

## 2018-07-28 ENCOUNTER — Other Ambulatory Visit: Payer: Self-pay

## 2018-07-28 DIAGNOSIS — R1011 Right upper quadrant pain: Secondary | ICD-10-CM

## 2018-07-28 DIAGNOSIS — R1013 Epigastric pain: Secondary | ICD-10-CM | POA: Insufficient documentation

## 2018-08-28 ENCOUNTER — Other Ambulatory Visit: Payer: Self-pay | Admitting: Nurse Practitioner

## 2018-08-30 NOTE — Telephone Encounter (Signed)
Needs OV for further refills. Refilled X 1 only.

## 2018-08-31 ENCOUNTER — Encounter: Payer: Self-pay | Admitting: Gastroenterology

## 2018-08-31 NOTE — Telephone Encounter (Signed)
PATIENT SCHEDULED AND LETTER SENT  °

## 2018-10-01 ENCOUNTER — Other Ambulatory Visit: Payer: Self-pay | Admitting: Gastroenterology

## 2018-11-17 ENCOUNTER — Ambulatory Visit: Payer: Medicaid Other | Admitting: Gastroenterology

## 2018-11-17 ENCOUNTER — Encounter: Payer: Self-pay | Admitting: Gastroenterology

## 2018-11-17 ENCOUNTER — Other Ambulatory Visit: Payer: Self-pay

## 2018-11-17 VITALS — BP 120/71 | HR 82 | Temp 98.5°F | Ht 61.0 in | Wt 177.8 lb

## 2018-11-17 DIAGNOSIS — K625 Hemorrhage of anus and rectum: Secondary | ICD-10-CM

## 2018-11-17 DIAGNOSIS — K5900 Constipation, unspecified: Secondary | ICD-10-CM | POA: Diagnosis not present

## 2018-11-17 DIAGNOSIS — Z86010 Personal history of colonic polyps: Secondary | ICD-10-CM

## 2018-11-17 MED ORDER — LINACLOTIDE 290 MCG PO CAPS: ORAL_CAPSULE | ORAL | 11 refills | Status: DC

## 2018-11-17 MED ORDER — CLENPIQ 10-3.5-12 MG-GM -GM/160ML PO SOLN: 1.0000 | Freq: Once | ORAL | 0 refills | Status: AC

## 2018-11-17 NOTE — Assessment & Plan Note (Signed)
Doing well overall on Linzess. Can add Miralax one capful daily as needed to help when she skips a day without a BM or feels like BM incomplete. Intermittent brbpr on toilet tissue likely due to hemorrhoids.   She is due for 3 year surveillance colonoscopy 02/2019. We are scheduling out towards end of 01/2019 due to [place] so we will go ahead and get her on the schedule. Plan for deep sedation given polypharmacy.  I have discussed the risks, alternatives, benefits with regards to but not limited to the risk of reaction to medication, bleeding, infection, perforation and the patient is agreeable to proceed. Written consent to be obtained.
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
## 2018-11-17 NOTE — Patient Instructions (Signed)
1. Continue Linzess once daily with food.  2. You can use Miralax one capfule daily as needed for constipation in addition to the [place]. You can choose to take every day or take if you don't have a good BM for 24 hours.  3. Colonoscopy 01/2019 as scheduled. See separate instructions.

## 2018-11-22 ENCOUNTER — Encounter: Payer: Self-pay | Admitting: *Deleted

## 2018-11-22 NOTE — Progress Notes (Signed)
CC'D TO PCP °

## 2019-02-17 ENCOUNTER — Encounter (HOSPITAL_COMMUNITY): Payer: Self-pay

## 2019-02-17 ENCOUNTER — Telehealth: Payer: Self-pay | Admitting: Gastroenterology

## 2019-02-17 ENCOUNTER — Other Ambulatory Visit: Payer: Self-pay

## 2019-02-17 NOTE — Telephone Encounter (Signed)
Pt asked if we could fax her prep instruction along with her prep rx to [facility] on [address]. She asked for a return call to [phone]

## 2019-02-18 ENCOUNTER — Encounter (HOSPITAL_COMMUNITY)
Admission: RE | Admit: 2019-02-18 | Discharge: 2019-02-18 | Disposition: A | Discharge status: Home / Self Care | Payer: Medicaid Other | Source: Ambulatory Visit | Attending: Gastroenterology | Admitting: Gastroenterology

## 2019-02-18 ENCOUNTER — Other Ambulatory Visit (HOSPITAL_COMMUNITY)
Admission: RE | Admit: 2019-02-18 | Discharge: 2019-02-18 | Disposition: A | Discharge status: Home / Self Care | Payer: Medicaid Other | Source: Ambulatory Visit | Attending: Gastroenterology | Admitting: Gastroenterology

## 2019-02-18 DIAGNOSIS — Z01812 Encounter for preprocedural laboratory examination: Secondary | ICD-10-CM | POA: Diagnosis not present

## 2019-02-18 DIAGNOSIS — Z20828 Contact with and (suspected) exposure to other viral communicable diseases: Secondary | ICD-10-CM | POA: Diagnosis not present

## 2019-02-18 LAB — SARS CORONAVIRUS 2 (TAT 6-24 HRS): SARS Coronavirus 2: NEGATIVE

## 2019-02-18 MED ORDER — CLENPIQ 10-3.5-12 MG-GM -GM/160ML PO SOLN: 1.0000 | Freq: Once | ORAL | 0 refills | Status: AC

## 2019-02-18 NOTE — Telephone Encounter (Signed)
Instructions faxed to pharmacy. Resent rx. Called and informed pt.

## 2019-02-21 ENCOUNTER — Other Ambulatory Visit: Payer: Self-pay

## 2019-02-21 ENCOUNTER — Telehealth: Payer: Self-pay | Admitting: Gastroenterology

## 2019-02-21 MED ORDER — CLENPIQ 10-3.5-12 MG-GM -GM/160ML PO SOLN: 1.0000 | Freq: Once | ORAL | 0 refills | Status: AC

## 2019-02-21 NOTE — Telephone Encounter (Signed)
Rx sent to [facility], called and informed pt.

## 2019-02-21 NOTE — Telephone Encounter (Signed)
Pt is scheduled for procedure tomorrow and her pharmacy does not have her prep and may get it later today. She asked if we could call it in to another pharmacy and requested [facility] on [address]. Please call [phone]

## 2019-02-21 NOTE — Telephone Encounter (Signed)
Rx sent to [facility] on [address] as requested. Called and informed pt.

## 2019-02-21 NOTE — Telephone Encounter (Signed)
Pt called back to say that the other pharmacy doesn't have her prep either and she checked with [facility] and asked to call the prescription into them instead. Please call her [phone]

## 2019-02-22 ENCOUNTER — Encounter (HOSPITAL_COMMUNITY)
Admission: RE | Disposition: A | Discharge status: Home / Self Care | Payer: Self-pay | Source: Home / Self Care | Attending: Gastroenterology

## 2019-02-22 ENCOUNTER — Ambulatory Visit (HOSPITAL_COMMUNITY)
Admission: RE | Admit: 2019-02-22 | Discharge: 2019-02-22 | Disposition: A | Discharge status: Home / Self Care | Payer: Medicaid Other | Attending: Gastroenterology | Admitting: Gastroenterology

## 2019-02-22 ENCOUNTER — Other Ambulatory Visit: Payer: Self-pay

## 2019-02-22 ENCOUNTER — Encounter (HOSPITAL_COMMUNITY): Payer: Self-pay

## 2019-02-22 ENCOUNTER — Ambulatory Visit (HOSPITAL_COMMUNITY): Payer: Medicaid Other | Admitting: Anesthesiology

## 2019-02-22 DIAGNOSIS — F17210 Nicotine dependence, cigarettes, uncomplicated: Secondary | ICD-10-CM | POA: Diagnosis not present

## 2019-02-22 DIAGNOSIS — K644 Residual hemorrhoidal skin tags: Secondary | ICD-10-CM | POA: Insufficient documentation

## 2019-02-22 DIAGNOSIS — K648 Other hemorrhoids: Secondary | ICD-10-CM | POA: Insufficient documentation

## 2019-02-22 DIAGNOSIS — E559 Vitamin D deficiency, unspecified: Secondary | ICD-10-CM | POA: Insufficient documentation

## 2019-02-22 DIAGNOSIS — H409 Unspecified glaucoma: Secondary | ICD-10-CM | POA: Insufficient documentation

## 2019-02-22 DIAGNOSIS — J45909 Unspecified asthma, uncomplicated: Secondary | ICD-10-CM | POA: Insufficient documentation

## 2019-02-22 DIAGNOSIS — Z86010 Personal history of colonic polyps: Secondary | ICD-10-CM | POA: Diagnosis not present

## 2019-02-22 DIAGNOSIS — Z888 Allergy status to other drugs, medicaments and biological substances status: Secondary | ICD-10-CM | POA: Insufficient documentation

## 2019-02-22 DIAGNOSIS — Z1211 Encounter for screening for malignant neoplasm of colon: Secondary | ICD-10-CM | POA: Diagnosis not present

## 2019-02-22 DIAGNOSIS — Z79899 Other long term (current) drug therapy: Secondary | ICD-10-CM | POA: Diagnosis not present

## 2019-02-22 DIAGNOSIS — Z79891 Long term (current) use of opiate analgesic: Secondary | ICD-10-CM | POA: Insufficient documentation

## 2019-02-22 DIAGNOSIS — M1990 Unspecified osteoarthritis, unspecified site: Secondary | ICD-10-CM | POA: Diagnosis not present

## 2019-02-22 DIAGNOSIS — I10 Essential (primary) hypertension: Secondary | ICD-10-CM | POA: Insufficient documentation

## 2019-02-22 DIAGNOSIS — K625 Hemorrhage of anus and rectum: Secondary | ICD-10-CM

## 2019-02-22 HISTORY — PX: COLONOSCOPY WITH PROPOFOL: SHX5780

## 2019-02-22 SURGERY — COLONOSCOPY WITH PROPOFOL
Anesthesia: General

## 2019-02-22 MED ORDER — LACTATED RINGERS IV SOLN
INTRAVENOUS | Status: DC | PRN
  Administered 2019-02-22: 13:00:00 via INTRAVENOUS

## 2019-02-22 MED ORDER — CHLORHEXIDINE GLUCONATE CLOTH 2 % EX PADS: 6.0000 | MEDICATED_PAD | Freq: Once | CUTANEOUS | Status: DC

## 2019-02-22 MED ORDER — PROPOFOL 500 MG/50ML IV EMUL
INTRAVENOUS | Status: DC | PRN
  Administered 2019-02-22: 150 ug/kg/min via INTRAVENOUS

## 2019-02-22 MED ORDER — KETAMINE HCL 10 MG/ML IJ SOLN
INTRAMUSCULAR | Status: DC | PRN
  Administered 2019-02-22: 10 mg via INTRAVENOUS

## 2019-02-22 MED ORDER — PROPOFOL 10 MG/ML IV BOLUS
INTRAVENOUS | Status: AC
  Filled 2019-02-22: qty 40

## 2019-02-22 MED ORDER — PROPOFOL 10 MG/ML IV BOLUS
INTRAVENOUS | Status: DC | PRN
  Administered 2019-02-22: 30 mg via INTRAVENOUS
  Administered 2019-02-22: 20 mg via INTRAVENOUS
  Administered 2019-02-22: 30 mg via INTRAVENOUS

## 2019-02-22 MED ORDER — KETAMINE HCL 50 MG/5ML IJ SOSY
PREFILLED_SYRINGE | INTRAMUSCULAR | Status: AC
  Filled 2019-02-22: qty 5

## 2019-02-22 MED ORDER — LACTATED RINGERS IV SOLN
Freq: Once | INTRAVENOUS | Status: AC
  Administered 2019-02-22: 13:00:00 via INTRAVENOUS
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
## 2019-02-22 NOTE — Discharge Instructions (Signed)
YOU DID NOT HAVE ANY POLYPS. You have  SMALL internal hemorrhoids.   EAT TO LIVE AND THINK OF FOOD AS MEDICINE. 75% OF YOUR PLATE SHOULD BE FRUITS/VEGGIES.  To have more energy, and to lose weight:      1. CONTINUE YOUR WEIGHT LOSS EFFORTS. I RECOMMEND YOU READ AND FOLLOW RECOMMENDATIONS BY DR. MARK HYMAN, "10-DAY DETOX DIET".    2. If you must eat bread, EAT EZEKIEL BREAD. IT IS IN THE FROZEN SECTION OF THE GROCERY STORE.    3. DRINK WATER WITH FRUIT OR CUCUMBER ADDED. YOUR URINE SHOULD BE LIGHT YELLOW. AVOID SODA, GATORADE, ENERGY DRINKS, OR DIET SODA.     4. AVOID HIGH FRUCTOSE CORN SYRUP AND CAFFEINE.     5. DO NOT chew SUGAR FREE GUM OR USE ARTIFICIAL SWEETENERS. IF NEEDED USE STEVIA AS A SWEETENER.    6. DO NOT EAT ENRICHED WHEAT FLOUR, PASTA, RICE, OR CEREAL.    7. ONLY EAT WILD CAUGHT SEAFOOD, GRASS FED BEEF OR CHICKEN, PORK FROM PASTURE RAISE PIGS, OR EGGS FROM PASTURE RAISED CHICKENS.    8. PRACTICE CHAIR YOGA FOR 15-30 MINS 3 OR 4 TIMES A WEEK AND PROGRESS TO HATHA YOGA OVER NEXT 6 MOS.    9. START TAKING A MULTIVITAMIN, VITAMIN B12, AND VITAMIN D3 2000 IU DAILY.   ADDITIONAL SUPPLEMENTS TO DECREASE CRAVING AND SUPPRESS YOUR APPETITE:    1. CINNAMON 500 MG EVERY AM PRIOR TO FIRST MEAL.   **STABILIZES BLOOD GLUCOSE**    2. CHROMIUM 400-500 MG WITH MEALS TWICE DAILY.    **FAT BURNER**    3. GREEN TEA EXTRACT ONE DAILY.   **FAT BURNER/SUPPRESSES YOUR APPETITE**    4. ALPHA LIPOIC ACID TWICE DAILY.   **NATURAL ANTI-INFLAMMATORY SUPPLEMENT THAT IS AN ALTERNATIVE TO IBUPROFEN**    Next colonoscopy in 5 years.    Colonoscopy Care After Read the instructions outlined below and refer to this sheet in the next week. These discharge instructions provide you with general information on caring for yourself after you leave the hospital. While your treatment has been planned according to the most current medical practices available, unavoidable complications occasionally  occur. If you have any problems or questions after discharge, call DR. Audry Pecina, [phone].  ACTIVITY  You may resume your regular activity, but move at a slower pace for the next 24 hours.   Take frequent rest periods for the next 24 hours.   Walking will help get rid of the air and reduce the bloated feeling in your belly (abdomen).   No driving for 24 hours (because of the medicine (anesthesia) used during the test).   You may shower.   Do not sign any important legal documents or operate any machinery for 24 hours (because of the anesthesia used during the test).    NUTRITION  Drink plenty of fluids.   You may resume your normal diet as instructed by your doctor.   Begin with a light meal and progress to your normal diet. Heavy or fried foods are harder to digest and may make you feel sick to your stomach (nauseated).   Avoid alcoholic beverages for 24 hours or as instructed.    MEDICATIONS  You may resume your normal medications.   WHAT YOU CAN EXPECT TODAY  Some feelings of bloating in the abdomen.   Passage of more gas than usual.   Spotting of blood in your stool or on the toilet paper  .  IF YOU HAD POLYPS REMOVED DURING THE COLONOSCOPY:  Eat a soft diet IF YOU HAVE NAUSEA, BLOATING, ABDOMINAL PAIN, OR VOMITING.    FINDING OUT THE RESULTS OF YOUR TEST Not all test results are available during your visit. DR. Oneida Alar WILL CALL YOU WITHIN 14 DAYS OF YOUR PROCEDUE WITH YOUR RESULTS. Do not assume everything is normal if you have not heard from DR. Luccia Reinheimer, CALL HER OFFICE AT [phone].  SEEK IMMEDIATE MEDICAL ATTENTION AND CALL THE OFFICE: [phone] IF:  You have more than a spotting of blood in your stool.   Your belly is swollen (abdominal distention).   You are nauseated or vomiting.   You have a temperature over 101F.   You have abdominal pain or discomfort that is severe or gets worse throughout the day.  High-Fiber Diet A high-fiber diet  changes your normal diet to include more whole grains, legumes, fruits, and vegetables. Changes in the diet involve replacing refined carbohydrates with unrefined foods. The calorie level of the diet is essentially unchanged. The Dietary Reference Intake (recommended amount) for adult males is 38 grams per day. For adult females, it is 25 grams per day. Pregnant and lactating women should consume 28 grams of fiber per day. Fiber is the intact part of a plant that is not broken down during digestion. Functional fiber is fiber that has been isolated from the plant to provide a beneficial effect in the body.  PURPOSE  Increase stool bulk.   Ease and regulate bowel movements.   Lower cholesterol.   REDUCE RISK OF COLON CANCER  INDICATIONS THAT YOU NEED MORE FIBER  Constipation and hemorrhoids.   Uncomplicated diverticulosis (intestine condition) and irritable bowel syndrome.   Weight management.   As a protective measure against hardening of the arteries (atherosclerosis), diabetes, and cancer.   GUIDELINES FOR INCREASING FIBER IN THE DIET  Start adding fiber to the diet slowly. A gradual increase of about 5 more grams (2 servings of most fruits or vegetables) per day is best. Too rapid an increase in fiber may result in constipation, flatulence, and bloating.   Drink enough water and fluids to keep your urine clear or pale yellow. Water, juice, or caffeine-free drinks are recommended. Not drinking enough fluid may cause constipation.   Eat a variety of high-fiber foods rather than one type of fiber.   Try to increase your intake of fiber through using high-fiber foods rather than fiber pills or supplements that contain small amounts of fiber.   The goal is to change the types of food eaten. Do not supplement your present diet with high-fiber foods, but replace foods in your present diet.

## 2019-02-22 NOTE — Op Note (Signed)
[facility] Patient Name: [name] Procedure Date: 02/22/2019 1:38 PM MRN: [id number] Date of Birth: 12-Jun-1955 Attending MD: Barney Drain MD, MD CSN: [id number] Age: 63 Admit Type: Outpatient Procedure:                Colonoscopy, SURVEILLANCE Indications:              Personal history of colonic polyps Providers:                Barney Drain MD, MD, Otis Peak B. Sharon Seller, RN,                            Randa Spike, Technician Referring MD:             Arcola Jansky. Cobb Medicines:                Propofol per Anesthesia Complications:            No immediate complications. Estimated Blood Loss:     Estimated blood loss: none. Procedure:                Pre-Anesthesia Assessment:                           - Prior to the procedure, a History and Physical                            was performed, and patient medications and                            allergies were reviewed. The patient's tolerance of                            previous anesthesia was also reviewed. The risks                            and benefits of the procedure and the sedation                            options and risks were discussed with the patient.                            All questions were answered, and informed consent                            was obtained. Prior Anticoagulants: The patient has                            taken no previous anticoagulant or antiplatelet                            agents. ASA Grade Assessment: II - A patient with                            mild systemic disease. After reviewing the risks  and benefits, the patient was deemed in                            satisfactory condition to undergo the procedure.                            After obtaining informed consent, the colonoscope                            was passed under direct vision. Throughout the                            procedure, the patient's blood pressure, pulse, and                oxygen saturations were monitored continuously. The                            [id number]-[id number]) scope was introduced through                            the anus and advanced to the the cecum, identified                            by appendiceal orifice and ileocecal valve. The                            colonoscopy was somewhat difficult due to a                            tortuous colon. Successful completion of the                            procedure was aided by straightening and shortening                            the scope to obtain bowel loop reduction and                            COLOWRAP. The patient tolerated the procedure well.                            The quality of the bowel preparation was excellent.                            The ileocecal valve, appendiceal orifice, and                            rectum were photographed. Scope In: 2:29:26 PM Scope Out: 2:44:40 PM Scope Withdrawal Time: 0 hours 13 minutes 19 seconds  Total Procedure Duration: 0 hours 15 minutes 14 seconds  Findings:      The recto-sigmoid colon and sigmoid colon were mildly tortuous.      External and internal hemorrhoids were found. Impression:               -  Tortuous colon.                           - External and internal hemorrhoids. Moderate Sedation:      Per Anesthesia Care Recommendation:           - Patient has a contact number available for                            emergencies. The signs and symptoms of potential                            delayed complications were discussed with the                            patient. Return to normal activities tomorrow.                            Written discharge instructions were provided to the                            patient.                           - High fiber diet.                           - Continue present medications.                           - Repeat colonoscopy in 5 years for surveillance. Procedure Code(s):         --- Professional ---                           [phone], Colonoscopy, flexible; diagnostic, including                            collection of specimen(s) by brushing or washing,                            when performed (separate procedure) Diagnosis Code(s):        --- Professional ---                           K64.8, Other hemorrhoids                           Z86.010, Personal history of colonic polyps                           Q43.8, Other specified congenital malformations of                            intestine CPT copyright 2019 American Medical Association. All rights reserved. The codes documented in this report are preliminary and upon coder review may  be revised to meet current compliance requirements. Barney Drain, MD Barney Drain MD, MD 02/22/2019 3:04:40 PM  This report has been signed electronically. Number of Addenda: 0

## 2019-02-22 NOTE — Transfer of Care (Signed)
Immediate Anesthesia Transfer of Care Note  Patient: [name]  Procedure(s) Performed: COLONOSCOPY WITH PROPOFOL (N/A )  Patient Location: PACU  Anesthesia Type:MAC  Level of Consciousness: awake, alert  and oriented  Airway & Oxygen Therapy: Patient Spontanous Breathing  Post-op Assessment: Report given to RN, Post -op Vital signs reviewed and stable and Patient moving all extremities X 4  Post vital signs: Reviewed and stable  Last Vitals:  Vitals Value Taken Time  BP    Temp    Pulse    Resp    SpO2      Last Pain:  Vitals:   02/22/19 1420  TempSrc:   PainSc: 0-No pain         Complications: No apparent anesthesia complications

## 2019-02-22 NOTE — Anesthesia Preprocedure Evaluation (Addendum)
Anesthesia Evaluation  Patient identified by MRN, date of birth, ID band Patient awake    Reviewed: Allergy & Precautions, NPO status , Patient's Chart, lab work & pertinent test results  History of Anesthesia Complications Negative for: history of anesthetic complications  Airway Mallampati: III  TM Distance: >3 FB Neck ROM: Full    Dental  (+) Missing, Dental Advisory Given   Pulmonary asthma , Current SmokerPatient did not abstain from smoking.,           Cardiovascular Exercise Tolerance: Good hypertension,    Stress test - 2018 There was no significant ST segment deviation noted during stress.  Defect 1: There is a medium defect of mild severity present in the mid anteroseptal location.  Findings consistent with a mild degree of ischemia. However, variable soft tissue attenuation artifact cannot entirely be ruled out.  This is a low risk study.  Nuclear stress EF: 50%.   Neuro/Psych Anxiety negative neurological ROS     GI/Hepatic negative GI ROS, Neg liver ROS,   Endo/Other  negative endocrine ROS  Renal/GU negative Renal ROS  negative genitourinary   Musculoskeletal  (+) Arthritis , Osteoarthritis,    Abdominal   Peds negative pediatric ROS (+)  Hematology negative hematology ROS (+)   Anesthesia Other Findings   Reproductive/Obstetrics negative OB ROS                           Anesthesia Physical Anesthesia Plan  ASA: II  Anesthesia Plan: General   Post-op Pain Management:    Induction: Intravenous  PONV Risk Score and Plan: TIVA  Airway Management Planned: Nasal Cannula, Natural Airway and Simple Face Mask  Additional Equipment:   Intra-op Plan:   Post-operative Plan:   Informed Consent: I have reviewed the patients History and Physical, chart, labs and discussed the procedure including the risks, benefits and alternatives for the proposed anesthesia with the  patient or authorized representative who has indicated his/her understanding and acceptance.     Dental advisory given  Plan Discussed with: CRNA  Anesthesia Plan Comments:         Anesthesia Quick Evaluation

## 2019-02-22 NOTE — Anesthesia Postprocedure Evaluation (Signed)
Anesthesia Post Note  Patient: [name]  Procedure(s) Performed: COLONOSCOPY WITH PROPOFOL (N/A )  Patient location during evaluation: PACU Anesthesia Type: MAC Level of consciousness: awake and alert and oriented Pain management: pain level controlled Vital Signs Assessment: post-procedure vital signs reviewed and stable Respiratory status: spontaneous breathing, respiratory function stable and nonlabored ventilation Cardiovascular status: stable Postop Assessment: no apparent nausea or vomiting Anesthetic complications: no     Last Vitals:  Vitals:   02/22/19 1246  BP: 123/63  Pulse: 93  Resp: 20  SpO2: 97%    Last Pain:  Vitals:   02/22/19 1420  TempSrc:   PainSc: 0-No pain                 Kamryn Messineo

## 2019-02-23 ENCOUNTER — Telehealth: Payer: Self-pay | Admitting: Gastroenterology

## 2019-02-23 NOTE — Telephone Encounter (Signed)
PT is aware it is the [employer] book.

## 2019-02-23 NOTE — Telephone Encounter (Signed)
[id number] SLF TOLD PATIENT ABOUT A BOOK REGARDING HEALTHY LIVING AND BETTER EATING AND THE PATIENT CAN NOT REMEMBER THE NAME OF THE BOOK.  SHE WAS TOLD ABOUT THE BOOK AFTER HER PROCEDURE

## 2019-02-25 ENCOUNTER — Encounter (HOSPITAL_COMMUNITY): Payer: Self-pay | Admitting: Gastroenterology

## 2019-03-28 ENCOUNTER — Other Ambulatory Visit: Payer: Self-pay | Admitting: Nurse Practitioner

## 2019-05-10 ENCOUNTER — Telehealth: Payer: Self-pay | Admitting: Gastroenterology

## 2019-05-10 NOTE — Telephone Encounter (Signed)
REVIEWED-NO ADDITIONAL RECOMMENDATIONS. 

## 2019-05-10 NOTE — Telephone Encounter (Signed)
[phone]  Patient called to speak to [place] or her nurse, please call

## 2019-05-10 NOTE — Telephone Encounter (Signed)
Pt said she is wanting her son to see Dr. Oneida Alar sometime for a colonoscopy.  I asked her to please have her son call us and she said she will do so.

## 2019-05-11 NOTE — Telephone Encounter (Signed)
PT left Vm that she would like to speak with Dr. Oneida Alar.  She has a few personal questions to discuss with her.  Her call back number is [phone].   Forwarding to Dr. Oneida Alar.

## 2019-05-13 NOTE — Telephone Encounter (Signed)
Noted! FYI to Veda Canning.

## 2019-05-13 NOTE — Telephone Encounter (Signed)
Called patient TO DISCUSS CONCERNS. HER SON IS 62 yo and needs a colonoscopy. HIS INSURANCE IS [employer]. RECOMMENDED SHE CALL HIS INSURANCE CO TO SEE IF PREVENTATIVE PROCEDURES ARE COVERED 100%she WOULD TO ESTABLISH CARE FOR HEME OCCULT CARDS OR COLOGUARD BUT COULD BE TRIAGED FOR A COLONOSCOPY.

## 2020-04-07 ENCOUNTER — Other Ambulatory Visit: Payer: Self-pay | Admitting: Nurse Practitioner

## 2020-07-19 ENCOUNTER — Other Ambulatory Visit: Payer: Self-pay | Admitting: Family Medicine

## 2020-07-19 ENCOUNTER — Other Ambulatory Visit (HOSPITAL_COMMUNITY): Payer: Self-pay | Admitting: Family Medicine

## 2020-07-19 DIAGNOSIS — F17210 Nicotine dependence, cigarettes, uncomplicated: Secondary | ICD-10-CM

## 2020-07-19 DIAGNOSIS — Z1231 Encounter for screening mammogram for malignant neoplasm of breast: Secondary | ICD-10-CM

## 2020-08-03 ENCOUNTER — Ambulatory Visit (HOSPITAL_COMMUNITY): Payer: Medicaid Other

## 2020-08-06 ENCOUNTER — Encounter (HOSPITAL_COMMUNITY): Payer: Self-pay

## 2020-08-06 ENCOUNTER — Ambulatory Visit (HOSPITAL_COMMUNITY): Payer: Medicaid Other

## 2020-08-08 ENCOUNTER — Ambulatory Visit (HOSPITAL_COMMUNITY): Payer: Medicaid Other

## 2020-08-20 ENCOUNTER — Ambulatory Visit (HOSPITAL_COMMUNITY): Payer: Medicaid Other

## 2020-08-22 ENCOUNTER — Ambulatory Visit (HOSPITAL_COMMUNITY)
Admission: RE | Admit: 2020-08-22 | Discharge: 2020-08-22 | Disposition: A | Discharge status: Home / Self Care | Payer: Medicaid Other | Source: Ambulatory Visit | Attending: Family Medicine | Admitting: Family Medicine

## 2020-08-22 ENCOUNTER — Other Ambulatory Visit: Payer: Self-pay

## 2020-08-22 DIAGNOSIS — Z1231 Encounter for screening mammogram for malignant neoplasm of breast: Secondary | ICD-10-CM | POA: Diagnosis not present

## 2020-08-27 ENCOUNTER — Other Ambulatory Visit (HOSPITAL_COMMUNITY): Payer: Self-pay | Admitting: Family Medicine

## 2020-08-27 ENCOUNTER — Inpatient Hospital Stay
Admission: RE | Admit: 2020-08-27 | Discharge: 2020-08-27 | Disposition: A | Discharge status: Home / Self Care | Payer: Self-pay | Source: Ambulatory Visit | Attending: Family Medicine | Admitting: Family Medicine

## 2020-08-27 DIAGNOSIS — Z1231 Encounter for screening mammogram for malignant neoplasm of breast: Secondary | ICD-10-CM

## 2020-08-29 ENCOUNTER — Encounter: Payer: Medicaid Other | Admitting: Obstetrics & Gynecology

## 2020-09-07 ENCOUNTER — Ambulatory Visit (HOSPITAL_COMMUNITY): Payer: Medicaid Other

## 2020-10-02 ENCOUNTER — Other Ambulatory Visit (HOSPITAL_COMMUNITY)
Admission: RE | Admit: 2020-10-02 | Discharge: 2020-10-02 | Disposition: A | Discharge status: Home / Self Care | Payer: Medicaid Other | Source: Ambulatory Visit | Attending: Obstetrics & Gynecology | Admitting: Obstetrics & Gynecology

## 2020-10-02 ENCOUNTER — Ambulatory Visit (INDEPENDENT_AMBULATORY_CARE_PROVIDER_SITE_OTHER): Payer: Medicaid Other | Admitting: Obstetrics & Gynecology

## 2020-10-02 ENCOUNTER — Other Ambulatory Visit: Payer: Self-pay

## 2020-10-02 ENCOUNTER — Encounter: Payer: Self-pay | Admitting: Obstetrics & Gynecology

## 2020-10-02 VITALS — BP 142/76 | HR 95 | Ht 61.5 in | Wt 156.0 lb

## 2020-10-02 DIAGNOSIS — N950 Postmenopausal bleeding: Secondary | ICD-10-CM | POA: Insufficient documentation

## 2020-10-02 DIAGNOSIS — Z113 Encounter for screening for infections with a predominantly sexual mode of transmission: Secondary | ICD-10-CM | POA: Diagnosis not present

## 2020-10-02 DIAGNOSIS — Z124 Encounter for screening for malignant neoplasm of cervix: Secondary | ICD-10-CM | POA: Diagnosis not present

## 2020-10-02 NOTE — Progress Notes (Signed)
GYN VISIT Patient name: [name] MRN [id number]  Date of birth: 05/18/1955 Chief Complaint:   New Patient (Initial Visit), Vaginal dryness, Vaginal Bleeding, and Rectal Bleeding  History of Present Illness:   [name] is a 65 y.o. [id number] postmenopausal female being seen today for the following concerns:   Postmenopausal bleeding: Fist noted vaginal bleeding for about ayear ago- usually happens a few times per year.  Bleeding will last for about 34min-1hr then resolve.  Typically this will happen after BM and after IC. Bleeding is bright red and yesterday fairly heavy- about a pint. Denies pelvic pain.     Sexually active- denies dryness or pain.  Uses lubricant with good results.  Also notes rectal bleeding- previously seen by GI, h/o polyps Denies constipation  No LMP recorded. Patient is postmenopausal.  Depression screen [facility] 2/9 10/02/2020  Decreased Interest 1  Down, Depressed, Hopeless 1  PHQ - 2 Score 2  Altered sleeping 2  Tired, decreased energy 2  Change in appetite 2  Feeling bad or failure about yourself  1  Trouble concentrating 1  Moving slowly or fidgety/restless 0  Suicidal thoughts 0  PHQ-9 Score 10     Review of Systems:   Pertinent items are noted in HPI Denies fever/chills, dizziness, headaches, visual disturbances, fatigue, shortness of breath, chest pain, abdominal pain, vomiting, bowel movements, urination, or intercourse unless otherwise stated above.  Pertinent History Reviewed:  Reviewed past medical,surgical, social, obstetrical and family history.  Reviewed problem list, medications and allergies. Physical Assessment:   Vitals:   10/02/20 1004  BP: (!) 142/76  Pulse: 95  Weight: 156 lb (70.8 kg)  Height: 5' 1.5" (1.562 m)  Body mass index is 29 kg/m.       Physical Examination:   General appearance: alert, well appearing, and in no distress  Psych: mood appropriate, normal affect  Skin: warm & dry   Cardiovascular:  normal heart rate noted  Respiratory: normal respiratory effort, no distress  Abdomen: soft, non-tender, no rebound, no guarding  Pelvic: normal external genitalia, vulva, vagina, cervix, uterus and adnexa  Rectal: no hemorrhoids seen- full exam deferred  Extremities: no edema   Chaperone: Marcelino Scot    Endometrial Biopsy Procedure Note  Pre-operative Diagnosis: postmenopausal bleeding  Post-operative Diagnosis: same  Procedure Details   Pt is postmenopausal. The risks (including infection, bleeding, pain, and uterine perforation) and benefits of the procedure were explained to the patient and Written informed consent was obtained.  Antibiotic prophylaxis against endocarditis was not indicated.   The patient was placed in the dorsal lithotomy position.  Bimanual exam showed the uterus to be in the neutral position.  A speculum inserted in the vagina, and the cervix prepped with betadine.     A single tooth tenaculum was applied to the anterior lip of the cervix for stabilization.  Os finder was used, due to mild cervical stenosis. A Pipelle endometrial aspirator was used to sample the endometrium.  Sample was sent for pathologic examination.  Condition: Stable  Complications: None   Assessment & Plan:  1) Postmenopausal bleeding -discussed potential causes ranging from atrophy to endometrial cancer -reviewed work up including TVUS and possible EMB.  Pt desires to proceed with EMB today- see procedure note above -plan for TVUS at next available -next step dependent upon results of biopsy and [place] The patient was advised to call for any fever or for prolonged or severe pain or bleeding. She was advised to use OTC  analgesics as needed for mild to moderate pain  2) Cervical cancer screening: -pap collected today  3) STI screening  To be completed today  4) Rectal bleeding -advised pt to f/u with GI   Orders Placed This Encounter  Procedures  . HIV Antibody (routine testing  w rflx)  . RPR  . Hepatitis C antibody  . Hepatitis B surface antigen    Return for next available for pelvic/abdominal US (postmenopausal bleeding).   Janyth Pupa, DO Attending [place], [facility] for [employer], [place]

## 2020-10-03 LAB — HEPATITIS B SURFACE ANTIGEN: Hepatitis B Surface Ag: NEGATIVE

## 2020-10-03 LAB — HIV ANTIBODY (ROUTINE TESTING W REFLEX): HIV Screen 4th Generation wRfx: NONREACTIVE

## 2020-10-03 LAB — CYTOLOGY - PAP
Chlamydia: NEGATIVE
Comment: NEGATIVE
Comment: NEGATIVE
Comment: NORMAL
Diagnosis: NEGATIVE
High risk HPV: NEGATIVE
Neisseria Gonorrhea: NEGATIVE

## 2020-10-03 LAB — SURGICAL PATHOLOGY

## 2020-10-03 LAB — HEPATITIS C ANTIBODY: Hep C Virus Ab: <0.1 s/co ratio (ref 0.0–0.9)

## 2020-10-03 LAB — RPR: RPR Ser Ql: NONREACTIVE

## 2020-10-05 ENCOUNTER — Ambulatory Visit (HOSPITAL_COMMUNITY): Admission: RE | Admit: 2020-10-05 | Payer: Medicaid Other | Source: Ambulatory Visit

## 2020-10-05 ENCOUNTER — Encounter (HOSPITAL_COMMUNITY): Payer: Self-pay

## 2020-10-05 ENCOUNTER — Telehealth: Payer: Self-pay

## 2020-10-09 NOTE — Telephone Encounter (Signed)
Pt called, no answer, vm left

## 2020-10-15 ENCOUNTER — Telehealth: Payer: Self-pay | Admitting: Obstetrics & Gynecology

## 2020-10-15 NOTE — Telephone Encounter (Signed)
Pt calling to check lab results & to request copy be printed for her to pick up   Please advise & call pt

## 2020-10-15 NOTE — Telephone Encounter (Signed)
Pt informed that labs all normal.

## 2020-10-25 ENCOUNTER — Other Ambulatory Visit: Payer: 59

## 2020-11-20 ENCOUNTER — Other Ambulatory Visit: Payer: Self-pay | Admitting: Obstetrics & Gynecology

## 2020-11-20 DIAGNOSIS — N950 Postmenopausal bleeding: Secondary | ICD-10-CM

## 2020-11-21 ENCOUNTER — Other Ambulatory Visit: Payer: Self-pay

## 2020-11-21 ENCOUNTER — Encounter: Payer: Self-pay | Admitting: Obstetrics & Gynecology

## 2020-11-21 ENCOUNTER — Ambulatory Visit (INDEPENDENT_AMBULATORY_CARE_PROVIDER_SITE_OTHER): Payer: Medicaid Other

## 2020-11-21 ENCOUNTER — Ambulatory Visit: Payer: Medicaid Other | Admitting: Obstetrics & Gynecology

## 2020-11-21 VITALS — BP 162/87 | HR 84 | Ht 61.2 in | Wt 154.0 lb

## 2020-11-21 DIAGNOSIS — N950 Postmenopausal bleeding: Secondary | ICD-10-CM | POA: Diagnosis not present

## 2020-11-21 DIAGNOSIS — K625 Hemorrhage of anus and rectum: Secondary | ICD-10-CM

## 2020-11-21 NOTE — Progress Notes (Signed)
PELVIC US TA/TV: heterogeneous retroverted uterus with multiple fibroids,(#1) fundal subserosal fibroid 3.1 x 2.3 x 3.6 cm,(#2) anterior submucosal fibroid 2 x 2.2 x 2.3 cm,(#3) posterior submucosal fibroid 2 x 1.9 x 1.5 cm,(#4) anterior left pedunculated fibroid 1.8 x 1.6 x 2 cm,EEC 3.1 mm,normal ovaries bilat,ovaries appear mobile,no free fluid,no pain during ultrasound  Chaperone Peggy

## 2020-11-21 NOTE — Progress Notes (Signed)
   GYN VISIT Patient name: [name] MRN [id number]  Date of birth: 1955-07-26 Chief Complaint:   Follow-up (U/S today/ haven't taking bp medication today)  History of Present Illness:   [name] is a 65 y.o. G2P2000 postmenopausal female being seen today for follow up regarding  PMB: Today she notes that she has no further vaginal bleeding. Seen 6/7 reported- bleeding about 110yrago that happened a few times. -Bleeding x 335m-1hr then resolve.  Often after IC.  Reports bleeding as moderate to heavy- ~ 1 pint. Denies pelvic or abdominal pain.     Reports no other gyn concern- denies irregular discharge, itching or irritation.  Denies pelvic pain.  Of note, she is still having rectal bleeding- noted on TP after a BM or anytime that she wipes.  No LMP recorded. Patient is postmenopausal.  [place]: 6.8cm retroverted uterus with multiple fibroids (4 see below), endometrium 3.20m34msymmetrical. normal ovaries bilat,ovaries appear mobile,no free fluid,no pain during ultrasound  (#1) fundal subserosal fibroid 3.1 x 2.3 x 3.6 cm,(#2) anterior submucosal fibroid 2 x 2.2 x 2.3 cm,(#3) posterior submucosal fibroid 2 x 1.9 x 1.5 cm,(#4) anterior left pedunculated fibroid 1.8 x 1.6 x 2 cm  [facility] 10/02/[facility]  Decreased Interest 1  Down, Depressed, Hopeless 1  PHQ - 2 Score 2  Altered sleeping 2  Tired, decreased energy 2  Change in appetite 2  Feeling bad or failure about yourself  1  Trouble concentrating 1  Moving slowly or fidgety/restless 0  Suicidal thoughts 0  PHQ-9 Score 10     Review of Systems:   Pertinent items are noted in HPI Denies fever/chills, dizziness, headaches, visual disturbances, fatigue, shortness of breath, chest pain, abdominal pain, vomiting,  urination, or intercourse unless otherwise stated above.  Pertinent History Reviewed:  Reviewed past medical,surgical, social, obstetrical and family history.  Reviewed problem list,  medications and allergies. Physical Assessment:   Vitals:   11/21/20 1045  BP: (!) 162/87  Pulse: 84  Weight: 154 lb (69.9 kg)  Height: 5' 1.2" (1.554 m)  Body mass index is 28.91 kg/m.       Physical Examination:   General appearance: alert, well appearing, and in no distress  Psych: mood appropriate, normal affect  Skin: warm & dry   Cardiovascular: normal heart rate noted  Respiratory: normal respiratory effort, no distress  Abdomen: soft, non-tender   Pelvic: normal external genitalia, vulva, vagina, cervix, uterus and adnexa, examination not indicated  Extremities: no edema   Chaperone: N/A    Assessment & Plan:  1) Postmenopausal bleeding -no further bleeding noted -reviewed US [place] that showed normal uterine lining, no further work up indicated at this time -should she note worsening of bleeding- RTC  2) Rectal bleeding -though colonoscopy up to date, strongly encouraged pt to f/u with PCP or GI -pt does have h/o hemorrhoids- reviewed conservative management, pt does keep stool soft as she is currently on Linzess   No orders of the defined types were placed in this encounter.   Return in about 1 year (around 11/21/2021) for Annual.   JenJanyth PupaO Attending [facility]

## 2021-01-03 ENCOUNTER — Other Ambulatory Visit: Payer: Self-pay | Admitting: Gastroenterology

## 2021-01-03 MED ORDER — LINACLOTIDE 290 MCG PO CAPS: ORAL_CAPSULE | ORAL | 1 refills | Status: DC

## 2021-01-03 NOTE — Addendum Note (Signed)
Addended by: Mahala Menghini on: 01/03/2021 09:32 PM   Modules accepted: Orders

## 2021-01-03 NOTE — Telephone Encounter (Signed)
Last seen in 2020. Will need office visit. Limited refills provided for linzess.

## 2021-01-04 ENCOUNTER — Encounter: Payer: Self-pay | Admitting: Gastroenterology

## 2021-01-21 ENCOUNTER — Encounter: Payer: Self-pay | Admitting: Internal Medicine

## 2021-05-14 ENCOUNTER — Encounter: Payer: Self-pay | Admitting: Gastroenterology

## 2021-06-07 ENCOUNTER — Ambulatory Visit: Payer: Medicaid Other | Admitting: Gastroenterology

## 2021-09-03 ENCOUNTER — Encounter: Payer: Self-pay | Admitting: Internal Medicine

## 2021-09-03 ENCOUNTER — Ambulatory Visit: Payer: Medicaid Other | Admitting: Gastroenterology
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
## 2021-10-01 ENCOUNTER — Encounter: Payer: Self-pay | Admitting: Gastroenterology

## 2021-10-01 ENCOUNTER — Ambulatory Visit: Payer: Medicaid Other | Admitting: Gastroenterology

## 2021-10-01 VITALS — BP 138/80 | HR 90 | Temp 97.7°F | Ht 61.5 in | Wt 151.4 lb

## 2021-10-01 DIAGNOSIS — K5900 Constipation, unspecified: Secondary | ICD-10-CM

## 2021-10-01 MED ORDER — LINACLOTIDE 290 MCG PO CAPS: ORAL_CAPSULE | ORAL | 3 refills | Status: DC

## 2021-10-01 NOTE — Patient Instructions (Signed)
Continue Linzess 224mg once daily. New RX sent to [facility] We will se you back in 11/2023 and at that time we will make arrangements for updating your colonoscopy. Call u[place]oreaif you have any questions/concerns, new symptoms, or if you have recurrent rectal bleeding.

## 2021-10-01 NOTE — Progress Notes (Signed)
GI Office Note    Referring Provider: Jerel Shepherd, FNP Primary Care Physician:  Jerel Shepherd, [place]  Primary Gastroenterologist: Elon Alas. Abbey Chatters, DO   Chief Complaint   Chief Complaint  Patient presents with   Follow-up    No current issues to discuss.     History of Present Illness   [name] is a 66 y.o. female presenting today for follow up for refills, Linzess. Last seen in 2020.  Colonoscopy up-to-date.  She is due for next colonoscopy in October 2025, history of adenomatous colon polyp.  Taking Linzess 290 mcg daily. BM regular, occasionally skips a day without a BM. Rare brbpr if wipes too much. No abdominal pain. Appetite good. No unintentional weight loss. Dropped 20 pounds on person. Maintaining currently weight. No heartburn. No dysphagia.   Colonoscopy 01/2019: external/internal hemorrhoids, tortuous colon. Next colonoscopy in 5 years.   Medications   Current Outpatient Medications  Medication Sig Dispense Refill   Albuterol Sulfate 108 (90 Base) MCG/ACT AEPB Inhale 2 puffs into the lungs every 6 (six) hours as needed (for shortness of breath).     amLODipine (NORVASC) 10 MG tablet Take 10 mg by mouth daily.     ASPIRIN LOW DOSE 81 MG EC tablet Take 81 mg by mouth daily.     atorvastatin (LIPITOR) 10 MG tablet Take 1 tablet by mouth at bedtime.     cetirizine (ZYRTEC) 10 MG tablet Take 10 mg by mouth daily as needed.     cyclobenzaprine (FLEXERIL) 10 MG tablet Take 10 mg by mouth 3 (three) times daily as needed for muscle spasms.     cycloSPORINE (RESTASIS) 0.05 % ophthalmic emulsion Place 1 drop into both eyes 2 (two) times daily.      diclofenac Sodium (VOLTAREN) 1 % GEL SMARTSIG:Gram(s) Topical 3 Times Daily     Fluticasone-Salmeterol (ADVAIR) 250-50 MCG/DOSE AEPB Inhale 1 puff into the lungs daily as needed (asthma). 2-3x's a week per patient     ibuprofen (ADVIL) 800 MG tablet Take 800 mg by mouth every 8 (eight) hours as needed.      linaclotide (LINZESS) 290 MCG CAPS capsule TAKE 1 CAPSULE BY MOUTH EVERY MORNING before breakfast 90 capsule 1   LUMIGAN 0.01 % SOLN SMARTSIG:In Eye(s)     montelukast (SINGULAIR) 10 MG tablet Take 10 mg by mouth at bedtime.  0   Oxycodone HCl 10 MG TABS Take 10 mg by mouth every 4 (four) hours.  0   Vitamin D, Ergocalciferol, (DRISDOL) 1.25 MG (50000 UT) CAPS capsule Take 50,000 Units by mouth every 7 (seven) days. Sundays     No current facility-administered medications for this visit.    Allergies   Allergies as of 10/01/2021 - Review Complete 10/01/2021  Allergen Reaction Noted   Ace inhibitors Swelling    Propoxyphene n-acetaminophen Nausea And Vomiting    Tape Rash 02/20/2016         Review of Systems   General: Negative for anorexia, weight loss, fever, chills, fatigue, weakness. ENT: Negative for hoarseness, difficulty swallowing , nasal congestion. CV: Negative for chest pain, angina, palpitations, dyspnea on exertion, peripheral edema.  Respiratory: Negative for dyspnea at rest, dyspnea on exertion, cough, sputum, wheezing.  GI: See history of present illness. GU:  Negative for dysuria, hematuria, urinary incontinence, urinary frequency, nocturnal urination.  Endo: Negative for unusual weight change.     Physical Exam   BP 138/80 (BP Location: Right Arm, Patient Position: Sitting, Cuff  Size: Normal)   Pulse 90   Temp 97.7 F (36.5 C) (Temporal)   Ht 5' 1.5" (1.562 m)   Wt 151 lb 6.4 oz (68.7 kg)   SpO2 96%   BMI 28.14 kg/m    General: Well-nourished, well-developed in no acute distress.  Eyes: No icterus. Mouth: Oropharyngeal mucosa moist and pink , no lesions erythema or exudate. Lungs: Clear to auscultation bilaterally.  Heart: Regular rate and rhythm, no murmurs rubs or gallops.  Abdomen: Bowel sounds are normal, nontender, nondistended, no hepatosplenomegaly or masses,  no abdominal bruits or hernia , no rebound or guarding.  Rectal: not performed   Extremities: No lower extremity edema. No clubbing or deformities. Neuro: Alert and oriented x 4   Skin: Warm and dry, no jaundice.   Psych: Alert and cooperative, normal mood and affect.  Labs   None available   Imaging Studies   No results found.  Assessment   Constipation: doing well on Linzess 227mg daily.   H/O colon polyps: due for next colonoscopy in 01/2024.    PLAN   Continue Linzess 295m daily.  Next colonoscopy is due in 01/2024. She will call sooner if any concerns, change in symptoms, recurrent brbpr.    LeLaureen OchsLeBobby RumpfMHTraskwoodPAHyde Parkastroenterology Associates

## 2021-10-02 ENCOUNTER — Other Ambulatory Visit (HOSPITAL_COMMUNITY): Payer: Self-pay | Admitting: Family Medicine

## 2021-10-02 DIAGNOSIS — Z1231 Encounter for screening mammogram for malignant neoplasm of breast: Secondary | ICD-10-CM

## 2021-10-10 ENCOUNTER — Ambulatory Visit (HOSPITAL_COMMUNITY)
Admission: RE | Admit: 2021-10-10 | Discharge: 2021-10-10 | Disposition: A | Discharge status: Home / Self Care | Payer: Medicaid Other | Source: Ambulatory Visit | Attending: Family Medicine | Admitting: Family Medicine

## 2021-10-10 DIAGNOSIS — Z1231 Encounter for screening mammogram for malignant neoplasm of breast: Secondary | ICD-10-CM | POA: Diagnosis present

## 2021-11-06 ENCOUNTER — Encounter: Payer: Self-pay | Admitting: Obstetrics & Gynecology

## 2021-11-06 ENCOUNTER — Ambulatory Visit (INDEPENDENT_AMBULATORY_CARE_PROVIDER_SITE_OTHER): Payer: Medicaid Other | Admitting: Obstetrics & Gynecology

## 2021-11-06 VITALS — BP 128/74 | HR 92 | Ht 61.0 in | Wt 156.8 lb

## 2021-11-06 DIAGNOSIS — Z113 Encounter for screening for infections with a predominantly sexual mode of transmission: Secondary | ICD-10-CM | POA: Diagnosis not present

## 2021-11-06 DIAGNOSIS — N941 Unspecified dyspareunia: Secondary | ICD-10-CM

## 2021-11-06 MED ORDER — ESTRADIOL 0.1 MG/GM VA CREA: TOPICAL_CREAM | VAGINAL | 12 refills | Status: DC

## 2021-11-06 NOTE — Progress Notes (Signed)
WELL-WOMAN EXAMINATION Patient name: [name] MRN [id number]  Date of birth: March 11, 1956 Chief Complaint:   Gynecologic Exam  History of Present Illness:   [name] is a 66 y.o. G2P2000 PM female being seen today for the following concerns:  -Dyspareunia: This has been an ongoing issue for quite some time.  It at some point had caused vagina bleeding, which she had mentioned previously.  Work up completed, which was negative and reports no further vaginal bleeding.  Denies vaginal discharge, itching, burning or irritation.  Using OTC lubricants with only minimal improvement.  Notes occasional bleeding from hemorrhoids- trying to stay hydrated and prevent constipation.  No LMP recorded. Patient is postmenopausal.   Last pap -pap up to date, 09/2020.  Last mammogram: 09/2021- [facility]. Last colonoscopy: 2020     10/02/2020   10:03 AM  Depression screen PHQ 2/9  Decreased Interest 1  Down, Depressed, Hopeless 1  PHQ - 2 Score 2  Altered sleeping 2  Tired, decreased energy 2  Change in appetite 2  Feeling bad or failure about yourself  1  Trouble concentrating 1  Moving slowly or fidgety/restless 0  Suicidal thoughts 0  PHQ-9 Score 10      Review of Systems:   Pertinent items are noted in HPI Denies any headaches, blurred vision, fatigue, shortness of breath, chest pain, abdominal pain, bowel movements, urination, or intercourse unless otherwise stated above.  Pertinent History Reviewed:  Reviewed past medical,surgical, social and family history.  Reviewed problem list, medications and allergies. Physical Assessment:   Vitals:   11/06/21 1514  BP: 128/74  Pulse: 92  Weight: 156 lb 12.8 oz (71.1 kg)  Height: '5\' 1"'$  (1.549 m)  Body mass index is 29.63 kg/m.        Physical Examination:   General appearance - well appearing, and in no distress  Mental status - alert, oriented to person, place, and time  Psych:  She has a normal mood and affect  Skin -  warm and dry, normal color, no suspicious lesions noted  Chest - effort normal, all lung fields clear to auscultation bilaterally  Heart - normal rate and regular rhythm  Neck:  midline trachea, no thyromegaly or nodules  Breasts - breasts appear normal, no suspicious masses, no skin or nipple changes or  axillary nodes  Abdomen - soft, nontender, nondistended, no masses or organomegaly  Pelvic - VULVA: normal appearing vulva with no masses, no tenderness or lesions  VAGINA: atrophic changes noted, pink flat mucosa- no lesions or discharge CERVIX: normal appearing cervix without discharge or lesions, no CMT  UTERUS: uterus is felt to be normal size, shape, consistency and nontender   ADNEXA: No adnexal masses or tenderness noted.  Extremities:  No swelling or varicosities noted  Chaperone: Celene Squibb     Assessment & Plan:  1) Dyspareunia -HRT risk assessment: Pt denies history of any of the following:  Untreated hypertension. Active liver disease with abnormal liver function tests. Active or recent arterial thromboembolic disease. Previous or current venous thromboembolism    -reviewed pros/cons vaginal estrogen cream.  Reviewed WHI study and black box warning.  Pt does not have any contraindications to treatment -Rx sent in, f/u in 3 mos  2) STI screening -to be completed today   Orders Placed This Encounter  Procedures   RPR   HIV Antibody (routine testing w rflx)    Meds:  Meds ordered this encounter  Medications   estradiol (ESTRACE VAGINAL)  0.1 MG/GM vaginal cream    Sig: 0.5g (pea-sized amount) twice per week    Dispense:  42.5 g    Refill:  12    Follow-up: Return in about 3 months (around 02/06/2022) for Medication follow up, with Dr. Nelda Marseille.   Janyth Pupa, DO Attending [place], [facility] for [employer], [place]

## 2021-11-07 LAB — RPR: RPR Ser Ql: NONREACTIVE

## 2021-11-07 LAB — HIV ANTIBODY (ROUTINE TESTING W REFLEX): HIV Screen 4th Generation wRfx: NONREACTIVE

## 2021-11-08 LAB — CERVICOVAGINAL ANCILLARY ONLY
Bacterial Vaginitis (gardnerella): NEGATIVE
Candida Glabrata: NEGATIVE
Candida Vaginitis: NEGATIVE
Chlamydia: NEGATIVE
Comment: NEGATIVE
Comment: NEGATIVE
Comment: NEGATIVE
Comment: NEGATIVE
Comment: NEGATIVE
Comment: NORMAL
Neisseria Gonorrhea: NEGATIVE
Trichomonas: NEGATIVE

## 2021-11-11 ENCOUNTER — Telehealth: Payer: Self-pay | Admitting: *Deleted

## 2021-11-11 NOTE — Telephone Encounter (Signed)
Pt aware STD work up was all negative. Pt voiced understanding. [place]

## 2021-11-11 NOTE — Telephone Encounter (Signed)
-----   Message from Janyth Pupa, DO sent at 11/08/2021  3:19 PM EDT ----- STI work up negative

## 2022-03-19 ENCOUNTER — Telehealth: Payer: Self-pay | Admitting: *Deleted

## 2022-03-19 ENCOUNTER — Ambulatory Visit (INDEPENDENT_AMBULATORY_CARE_PROVIDER_SITE_OTHER): Payer: Medicaid Other | Admitting: Gastroenterology

## 2022-03-19 ENCOUNTER — Encounter: Payer: Self-pay | Admitting: Gastroenterology

## 2022-03-19 ENCOUNTER — Encounter: Payer: Self-pay | Admitting: *Deleted

## 2022-03-19 ENCOUNTER — Ambulatory Visit: Payer: Medicaid Other | Admitting: Gastroenterology

## 2022-03-19 VITALS — BP 104/68 | HR 109 | Temp 96.6°F | Ht 61.5 in | Wt 149.9 lb

## 2022-03-19 DIAGNOSIS — K625 Hemorrhage of anus and rectum: Secondary | ICD-10-CM

## 2022-03-19 MED ORDER — HYDROCORTISONE (PERIANAL) 2.5 % EX CREA: 1.0000 | TOPICAL_CREAM | Freq: Two times a day (BID) | CUTANEOUS | 1 refills | Status: DC

## 2022-03-19 MED ORDER — CLENPIQ 10-3.5-12 MG-GM -GM/175ML PO SOLN: 1.0000 | ORAL | 0 refills | Status: DC

## 2022-03-19 NOTE — Telephone Encounter (Signed)
PA submitted via [place]. PA pending review. The notification/prior authorization reference number is [id number]

## 2022-03-19 NOTE — Patient Instructions (Signed)
We are arranging a colonoscopy in the near future with Dr. Abbey Chatters.  I have sent in Anusol cream to use per rectum twice a day as needed for rectal bleeding.  Will see you in follow-up thereafter!  It was a pleasure to see you today. I want to create trusting relationships with patients to provide genuine, compassionate, and quality care. I value your feedback. If you receive a survey regarding your visit,  I greatly appreciate you taking time to fill this out.   Annitta Needs, PhD, ANP-BC [facility]
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
## 2022-03-31 NOTE — Telephone Encounter (Signed)
PA approved. DOS: 04/08/22-07/07/22

## 2022-04-07 ENCOUNTER — Telehealth: Payer: Self-pay | Admitting: *Deleted

## 2022-04-07 ENCOUNTER — Telehealth: Payer: Self-pay

## 2022-04-07 NOTE — Telephone Encounter (Signed)
PA done for hydrocortisone 2.5% rectal cream. Dx used: K62.5 , last 2 ov notes were faxed. Waiting on a response from Cover my Meds.

## 2022-04-07 NOTE — Telephone Encounter (Signed)
Pt called about the instructions for her prep. The instructions that were given to her by the office says drink 1 bottle at 4 pm and the other bottle at 10 pm, but the instructions that came with the prep, says drink 1 bottle today and the other tomorrow. Advised pt to go by the instructions that were given to her by the office, so drink 1 bottle at 4 pm and the other bottle at 1- pm. Pt verbalized understanding.

## 2022-04-08 ENCOUNTER — Encounter (HOSPITAL_COMMUNITY): Payer: Self-pay

## 2022-04-08 ENCOUNTER — Ambulatory Visit (HOSPITAL_COMMUNITY)
Admission: RE | Admit: 2022-04-08 | Discharge: 2022-04-08 | Disposition: A | Discharge status: Home / Self Care | Payer: Medicaid Other | Attending: Internal Medicine | Admitting: Internal Medicine

## 2022-04-08 ENCOUNTER — Ambulatory Visit (HOSPITAL_BASED_OUTPATIENT_CLINIC_OR_DEPARTMENT_OTHER): Payer: Medicaid Other | Admitting: Anesthesiology

## 2022-04-08 ENCOUNTER — Ambulatory Visit (HOSPITAL_COMMUNITY): Payer: Medicaid Other | Admitting: Anesthesiology

## 2022-04-08 ENCOUNTER — Encounter (HOSPITAL_COMMUNITY)
Admission: RE | Disposition: A | Discharge status: Home / Self Care | Payer: Self-pay | Source: Home / Self Care | Attending: Internal Medicine

## 2022-04-08 ENCOUNTER — Other Ambulatory Visit: Payer: Self-pay

## 2022-04-08 DIAGNOSIS — F17210 Nicotine dependence, cigarettes, uncomplicated: Secondary | ICD-10-CM | POA: Insufficient documentation

## 2022-04-08 DIAGNOSIS — Z1212 Encounter for screening for malignant neoplasm of rectum: Secondary | ICD-10-CM | POA: Diagnosis not present

## 2022-04-08 DIAGNOSIS — K648 Other hemorrhoids: Secondary | ICD-10-CM | POA: Diagnosis not present

## 2022-04-08 DIAGNOSIS — I10 Essential (primary) hypertension: Secondary | ICD-10-CM | POA: Insufficient documentation

## 2022-04-08 DIAGNOSIS — Z1211 Encounter for screening for malignant neoplasm of colon: Secondary | ICD-10-CM

## 2022-04-08 DIAGNOSIS — J45909 Unspecified asthma, uncomplicated: Secondary | ICD-10-CM | POA: Insufficient documentation

## 2022-04-08 DIAGNOSIS — Z5986 Financial insecurity: Secondary | ICD-10-CM | POA: Diagnosis not present

## 2022-04-08 HISTORY — PX: COLONOSCOPY WITH PROPOFOL: SHX5780

## 2022-04-08 SURGERY — COLONOSCOPY WITH PROPOFOL
Anesthesia: General

## 2022-04-08 MED ORDER — PROPOFOL 10 MG/ML IV BOLUS
INTRAVENOUS | Status: DC | PRN
  Administered 2022-04-08: 50 mg via INTRAVENOUS

## 2022-04-08 MED ORDER — PROPOFOL 500 MG/50ML IV EMUL
INTRAVENOUS | Status: DC | PRN
  Administered 2022-04-08: 175 ug/kg/min via INTRAVENOUS

## 2022-04-08 MED ORDER — LACTATED RINGERS IV SOLN: INTRAVENOUS | Status: DC

## 2022-04-08 MED ORDER — PHENYLEPHRINE 80 MCG/ML (10ML) SYRINGE FOR IV PUSH (FOR BLOOD PRESSURE SUPPORT)
PREFILLED_SYRINGE | INTRAVENOUS | Status: DC | PRN
  Administered 2022-04-08: 80 ug via INTRAVENOUS

## 2022-04-08 MED ORDER — LACTATED RINGERS IV SOLN: INTRAVENOUS | Status: DC | PRN

## 2022-04-08 NOTE — Transfer of Care (Signed)
Immediate Anesthesia Transfer of Care Note  Patient: [name]  Procedure(s) Performed: COLONOSCOPY WITH PROPOFOL  Patient Location: PACU  Anesthesia Type:General  Level of Consciousness: awake, alert , and oriented  Airway & Oxygen Therapy: Patient Spontanous Breathing  Post-op Assessment: Report given to RN and Post -op Vital signs reviewed and stable  Post vital signs: Reviewed and stable  Last Vitals:  Vitals Value Taken Time  BP    Temp    Pulse    Resp    SpO2      Last Pain:  Vitals:   04/08/22 1229  TempSrc:   PainSc: 3       Patients Stated Pain Goal: 6 (46/95/07 2257)  Complications: No notable events documented.

## 2022-04-08 NOTE — Anesthesia Postprocedure Evaluation (Signed)
Anesthesia Post Note  Patient: [name]  Procedure(s) Performed: COLONOSCOPY WITH PROPOFOL  Patient location during evaluation: Endoscopy Anesthesia Type: General Level of consciousness: awake and alert Pain management: pain level controlled Vital Signs Assessment: post-procedure vital signs reviewed and stable Respiratory status: spontaneous breathing, nonlabored ventilation, respiratory function stable and patient connected to nasal cannula oxygen Cardiovascular status: blood pressure returned to baseline and stable Postop Assessment: no apparent nausea or vomiting Anesthetic complications: no   There were no known notable events for this encounter.   Last Vitals:  Vitals:   04/08/22 1142 04/08/22 1247  BP: 128/64 137/71  Pulse: 82 87  Resp: 12 18  Temp: 36.9 C 36.7 C  SpO2: 100% 100%    Last Pain:  Vitals:   04/08/22 1247  TempSrc: Oral  PainSc: 0-No pain                 Trixie Rude
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
## 2022-04-08 NOTE — Discharge Instructions (Addendum)
  Colonoscopy Discharge Instructions  Read the instructions outlined below and refer to this sheet in the next few weeks. These discharge instructions provide you with general information on caring for yourself after you leave the hospital. Your doctor may also give you specific instructions. While your treatment has been planned according to the most current medical practices available, unavoidable complications occasionally occur.   ACTIVITY You may resume your regular activity, but move at a slower pace for the next 24 hours.  Take frequent rest periods for the next 24 hours.  Walking will help get rid of the air and reduce the bloated feeling in your belly (abdomen).  No driving for 24 hours (because of the medicine (anesthesia) used during the test).   Do not sign any important legal documents or operate any machinery for 24 hours (because of the anesthesia used during the test).  NUTRITION Drink plenty of fluids.  You may resume your normal diet as instructed by your doctor.  Begin with a light meal and progress to your normal diet. Heavy or fried foods are harder to digest and may make you feel sick to your stomach (nauseated).  Avoid alcoholic beverages for 24 hours or as instructed.  MEDICATIONS You may resume your normal medications unless your doctor tells you otherwise.  WHAT YOU CAN EXPECT TODAY Some feelings of bloating in the abdomen.  Passage of more gas than usual.  Spotting of blood in your stool or on the toilet paper.  IF YOU HAD POLYPS REMOVED DURING THE COLONOSCOPY: No aspirin products for 7 days or as instructed.  No alcohol for 7 days or as instructed.  Eat a soft diet for the next 24 hours.  FINDING OUT THE RESULTS OF YOUR TEST Not all test results are available during your visit. If your test results are not back during the visit, make an appointment with your caregiver to find out the results. Do not assume everything is normal if you have not heard from your  caregiver or the medical facility. It is important for you to follow up on all of your test results.  SEEK IMMEDIATE MEDICAL ATTENTION IF: You have more than a spotting of blood in your stool.  Your belly is swollen (abdominal distention).  You are nauseated or vomiting.  You have a temperature over 101.  You have abdominal pain or discomfort that is severe or gets worse throughout the day.   Your colonoscopy was relatively unremarkable.  I did not find any polyps or evidence of colon cancer.  I recommend repeating colonoscopy in 10 years for colon cancer screening purposes.    You do have significant internal hemorrhoids.  1 with moderate amount of irritation.  Recommend starting Anusol cream.  We will set you up for hemorrhoid banding.  If no improvement after banding and cream, we may need to repeat exam with flexible sigmoidoscopy to reevaluate.  I hope you have a great rest of your week!  Elon Alas. Abbey Chatters, D.O. Gastroenterology and Hepatology [facility]

## 2022-04-08 NOTE — Anesthesia Preprocedure Evaluation (Signed)
Anesthesia Evaluation  Patient identified by MRN, date of birth, ID band Patient awake    Reviewed: Allergy & Precautions, NPO status , Patient's Chart, lab work & pertinent test results  History of Anesthesia Complications Negative for: history of anesthetic complications  Airway Mallampati: III  TM Distance: >3 FB Neck ROM: Full    Dental  (+) Missing, Dental Advisory Given   Pulmonary asthma , Current SmokerPatient did not abstain from smoking.          Cardiovascular Exercise Tolerance: Good hypertension,    Stress test - 2018 There was no significant ST segment deviation noted during stress.  Defect 1: There is a medium defect of mild severity present in the mid anteroseptal location.  Findings consistent with a mild degree of ischemia. However, variable soft tissue attenuation artifact cannot entirely be ruled out.  This is a low risk study.  Nuclear stress EF: 50%.   Neuro/Psych   Anxiety     negative neurological ROS     GI/Hepatic negative GI ROS, Neg liver ROS,,,  Endo/Other  negative endocrine ROS    Renal/GU negative Renal ROS  negative genitourinary   Musculoskeletal  (+) Arthritis , Osteoarthritis,    Abdominal   Peds negative pediatric ROS (+)  Hematology negative hematology ROS (+)   Anesthesia Other Findings   Reproductive/Obstetrics negative OB ROS                             Anesthesia Physical Anesthesia Plan  ASA: 2  Anesthesia Plan: General   Post-op Pain Management:    Induction: Intravenous  PONV Risk Score and Plan: TIVA  Airway Management Planned: Nasal Cannula, Natural Airway and Simple Face Mask  Additional Equipment:   Intra-op Plan:   Post-operative Plan:   Informed Consent: I have reviewed the patients History and Physical, chart, labs and discussed the procedure including the risks, benefits and alternatives for the proposed  anesthesia with the patient or authorized representative who has indicated his/her understanding and acceptance.     Dental advisory given  Plan Discussed with: CRNA  Anesthesia Plan Comments:         Anesthesia Quick Evaluation

## 2022-04-08 NOTE — Op Note (Signed)
[facility] Patient Name: [name] Procedure Date: 04/08/2022 11:39 AM MRN: [id number] Date of Birth: [birth year] Attending MD: Elon Alas. Abbey Chatters , [place], [id number] CSN: [id number] Age: 66 Admit Type: Outpatient Procedure:                Colonoscopy Indications:              Screening for colorectal malignant neoplasm Providers:                Elon Alas. Abbey Chatters, DO, Hughie Closs RN, RN, Wynonia Musty Tech, Technician, Tammy Vaught, Therapist, sports,                            [employer] Risa Grill, Technician Referring MD:              Medicines:                See the Anesthesia note for documentation of the                            administered medications Complications:            No immediate complications. Estimated Blood Loss:     Estimated blood loss: none. Procedure:                Pre-Anesthesia Assessment:                           - The anesthesia plan was to use monitored                            anesthesia care (MAC).                           After obtaining informed consent, the colonoscope                            was passed under direct vision. Throughout the                            procedure, the patient's blood pressure, pulse, and                            oxygen saturations were monitored continuously. The                            [id number]-HQ190L ([id number]) scope was introduced through                            the anus and advanced to the the cecum, identified                            by appendiceal orifice and ileocecal valve. The                            colonoscopy was  performed without difficulty. The                            patient tolerated the procedure well. The quality                            of the bowel preparation was evaluated using the                            BBPS [facility] Bowel Preparation Scale) with scores                            of: Right Colon = 3, Transverse Colon = 3 and Left                             Colon = 3 (entire mucosa seen well with no residual                            staining, small fragments of stool or opaque                            liquid). The total BBPS score equals 9. Scope In: 12:34:28 PM Scope Out: 12:45:40 PM Scope Withdrawal Time: 0 hours 7 minutes 34 seconds  Total Procedure Duration: 0 hours 11 minutes 12 seconds  Findings:      Hemorrhoids were found on perianal exam.      Non-bleeding internal hemorrhoids were found during retroflexion.      The exam was otherwise without abnormality. Impression:               - Hemorrhoids found on perianal exam.                           - Non-bleeding internal hemorrhoids.                           - The examination was otherwise normal.                           - No specimens collected. Moderate Sedation:      Per Anesthesia Care Recommendation:           - Patient has a contact number available for                            emergencies. The signs and symptoms of potential                            delayed complications were discussed with the                            patient. Return to normal activities tomorrow.                            Written discharge instructions were provided to the  patient.                           - Resume previous diet.                           - Continue present medications.                           - Repeat colonoscopy in 10 years for screening                            purposes.                           - Return to GI clinic at the next available                            appointment with Roseanne Kaufman for hemorrhoid banding.                            One of the hemorrhoids with evidence of irritation,                            prolapsed on rectum exam. Patient to try anusol                            cream. If no improvement after banding and anusol                            cream, consider repeat flexible sigmoidosocpy in 6                             months to re evaluate. Procedure Code(s):        --- Professional ---                           F0277, Colorectal cancer screening; colonoscopy on                            individual not meeting criteria for high risk Diagnosis Code(s):        --- Professional ---                           Z12.11, Encounter for screening for malignant                            neoplasm of colon                           K64.8, Other hemorrhoids CPT copyright 2022 American Medical Association. All rights reserved. The codes documented in this report are preliminary and upon coder review may  be revised to meet current compliance requirements. Elon Alas. Abbey Chatters, DO [place] Abbey Chatters, DO 04/08/2022 1:05:30 PM This report has been signed electronically. Number of Addenda: 0

## 2022-04-11 NOTE — Telephone Encounter (Signed)
Pt's medication did not need a PA according to Cover My Meds.  Phoned the pt and advised her of this. Pt had already got her Rx with the card.

## 2022-04-15 ENCOUNTER — Encounter (HOSPITAL_COMMUNITY): Payer: Self-pay | Admitting: Internal Medicine

## 2022-04-16 NOTE — Addendum Note (Signed)
Addendum  created 04/16/22 1242 by Karna Dupes, CRNA   Intraprocedure Staff edited

## 2022-05-15 ENCOUNTER — Encounter: Payer: Medicaid Other | Admitting: Gastroenterology

## 2022-05-29 ENCOUNTER — Inpatient Hospital Stay
Admission: EM | Admit: 2022-05-29 | Discharge: 2022-06-01 | DRG: 440 | Disposition: A | Discharge status: Home / Self Care | Payer: Medicaid Other | Attending: Obstetrics and Gynecology | Admitting: Obstetrics and Gynecology

## 2022-05-29 ENCOUNTER — Other Ambulatory Visit: Payer: Self-pay

## 2022-05-29 ENCOUNTER — Emergency Department: Payer: Medicaid Other

## 2022-05-29 DIAGNOSIS — F419 Anxiety disorder, unspecified: Secondary | ICD-10-CM | POA: Diagnosis present

## 2022-05-29 DIAGNOSIS — Z832 Family history of diseases of the blood and blood-forming organs and certain disorders involving the immune mechanism: Secondary | ICD-10-CM

## 2022-05-29 DIAGNOSIS — Z8249 Family history of ischemic heart disease and other diseases of the circulatory system: Secondary | ICD-10-CM

## 2022-05-29 DIAGNOSIS — I10 Essential (primary) hypertension: Secondary | ICD-10-CM | POA: Insufficient documentation

## 2022-05-29 DIAGNOSIS — J45909 Unspecified asthma, uncomplicated: Secondary | ICD-10-CM | POA: Diagnosis present

## 2022-05-29 DIAGNOSIS — Z833 Family history of diabetes mellitus: Secondary | ICD-10-CM

## 2022-05-29 DIAGNOSIS — F1010 Alcohol abuse, uncomplicated: Secondary | ICD-10-CM | POA: Diagnosis present

## 2022-05-29 DIAGNOSIS — Z808 Family history of malignant neoplasm of other organs or systems: Secondary | ICD-10-CM

## 2022-05-29 DIAGNOSIS — Z79899 Other long term (current) drug therapy: Secondary | ICD-10-CM

## 2022-05-29 DIAGNOSIS — K760 Fatty (change of) liver, not elsewhere classified: Secondary | ICD-10-CM | POA: Diagnosis present

## 2022-05-29 DIAGNOSIS — H5789 Other specified disorders of eye and adnexa: Secondary | ICD-10-CM | POA: Diagnosis present

## 2022-05-29 DIAGNOSIS — F1090 Alcohol use, unspecified, uncomplicated: Secondary | ICD-10-CM

## 2022-05-29 DIAGNOSIS — Z841 Family history of disorders of kidney and ureter: Secondary | ICD-10-CM

## 2022-05-29 DIAGNOSIS — K649 Unspecified hemorrhoids: Secondary | ICD-10-CM | POA: Diagnosis present

## 2022-05-29 DIAGNOSIS — G8929 Other chronic pain: Secondary | ICD-10-CM

## 2022-05-29 DIAGNOSIS — F17210 Nicotine dependence, cigarettes, uncomplicated: Secondary | ICD-10-CM | POA: Diagnosis present

## 2022-05-29 DIAGNOSIS — Z87898 Personal history of other specified conditions: Secondary | ICD-10-CM

## 2022-05-29 DIAGNOSIS — Z83438 Family history of other disorder of lipoprotein metabolism and other lipidemia: Secondary | ICD-10-CM

## 2022-05-29 DIAGNOSIS — R197 Diarrhea, unspecified: Secondary | ICD-10-CM | POA: Diagnosis present

## 2022-05-29 DIAGNOSIS — K852 Alcohol induced acute pancreatitis without necrosis or infection: Principal | ICD-10-CM | POA: Diagnosis present

## 2022-05-29 LAB — CBC WITH DIFFERENTIAL/PLATELET
Abs Immature Granulocytes: 0.02 10*3/uL (ref 0.00–0.07)
Basophils Absolute: 0 10*3/uL (ref 0.0–0.1)
Basophils Relative: 0 %
Eosinophils Absolute: 0 10*3/uL (ref 0.0–0.5)
Eosinophils Relative: 0 %
HCT: 37.6 % (ref 36.0–46.0)
Hemoglobin: 12.4 g/dL (ref 12.0–15.0)
Immature Granulocytes: 0 %
Lymphocytes Relative: 13 %
Lymphs Abs: 1 10*3/uL (ref 0.7–4.0)
MCH: 28.6 pg (ref 26.0–34.0)
MCHC: 33 g/dL (ref 30.0–36.0)
MCV: 86.6 fL (ref 80.0–100.0)
Monocytes Absolute: 0.3 10*3/uL (ref 0.1–1.0)
Monocytes Relative: 4 %
Neutro Abs: 6.2 10*3/uL (ref 1.7–7.7)
Neutrophils Relative %: 83 %
Platelets: 334 10*3/uL (ref 150–400)
RBC: 4.34 MIL/uL (ref 3.87–5.11)
RDW: 17.2 % — ABNORMAL HIGH (ref 11.5–15.5)
WBC: 7.6 10*3/uL (ref 4.0–10.5)
nRBC: 0 % (ref 0.0–0.2)

## 2022-05-29 LAB — URINALYSIS, ROUTINE W REFLEX MICROSCOPIC
Bilirubin Urine: NEGATIVE
Glucose, UA: NEGATIVE mg/dL
Ketones, ur: NEGATIVE mg/dL
Leukocytes,Ua: NEGATIVE
Nitrite: NEGATIVE
Protein, ur: 100 mg/dL — AB
Specific Gravity, Urine: 1.017 (ref 1.005–1.030)
pH: 6 (ref 5.0–8.0)

## 2022-05-29 LAB — COMPREHENSIVE METABOLIC PANEL
ALT: 51 U/L — ABNORMAL HIGH (ref 0–44)
AST: 120 U/L — ABNORMAL HIGH (ref 15–41)
Albumin: 4.7 g/dL (ref 3.5–5.0)
Alkaline Phosphatase: 50 U/L (ref 38–126)
Anion gap: 10 (ref 5–15)
BUN: 7 mg/dL — ABNORMAL LOW (ref 8–23)
CO2: 26 mmol/L (ref 22–32)
Calcium: 10 mg/dL (ref 8.9–10.3)
Chloride: 102 mmol/L (ref 98–111)
Creatinine, Ser: 0.47 mg/dL (ref 0.44–1.00)
GFR, Estimated: >60 mL/min (ref >60)
Glucose, Bld: 144 mg/dL — ABNORMAL HIGH (ref 70–99)
Potassium: 2.8 mmol/L — ABNORMAL LOW (ref 3.5–5.1)
Sodium: 138 mmol/L (ref 135–145)
Total Bilirubin: 1 mg/dL (ref 0.3–1.2)
Total Protein: 7.4 g/dL (ref 6.5–8.1)

## 2022-05-29 LAB — LIPASE, BLOOD: Lipase: 536 U/L — ABNORMAL HIGH (ref 11–51)

## 2022-05-29 LAB — TROPONIN I (HIGH SENSITIVITY)
Troponin I (High Sensitivity): <2 ng/L (ref <18)
Troponin I (High Sensitivity): 3 ng/L (ref <18)

## 2022-05-29 LAB — MAGNESIUM: Magnesium: 1.6 mg/dL — ABNORMAL LOW (ref 1.7–2.4)

## 2022-05-29 MED ORDER — ONDANSETRON HCL 4 MG/2ML IJ SOLN
4.0000 mg | Freq: Once | INTRAMUSCULAR | Status: AC
  Administered 2022-05-29: 4 mg via INTRAVENOUS
  Filled 2022-05-29: qty 2

## 2022-05-29 MED ORDER — MAGNESIUM SULFATE 2 GM/50ML IV SOLN
2.0000 g | Freq: Once | INTRAVENOUS | Status: AC
  Administered 2022-05-29: 2 g via INTRAVENOUS
  Filled 2022-05-29: qty 50

## 2022-05-29 MED ORDER — POTASSIUM CHLORIDE CRYS ER 20 MEQ PO TBCR
40.0000 meq | EXTENDED_RELEASE_TABLET | Freq: Once | ORAL | Status: AC
  Administered 2022-05-29: 40 meq via ORAL
  Filled 2022-05-29: qty 2

## 2022-05-29 MED ORDER — LACTATED RINGERS IV BOLUS
1000.0000 mL | Freq: Once | INTRAVENOUS | Status: AC
  Administered 2022-05-29: 1000 mL via INTRAVENOUS

## 2022-05-29 MED ORDER — IOHEXOL 350 MG/ML SOLN
75.0000 mL | Freq: Once | INTRAVENOUS | Status: AC | PRN
  Administered 2022-05-29: 75 mL via INTRAVENOUS

## 2022-05-29 MED ORDER — MORPHINE SULFATE (PF) 4 MG/ML IV SOLN
4.0000 mg | Freq: Once | INTRAVENOUS | Status: AC
  Administered 2022-05-29: 4 mg via INTRAVENOUS
  Filled 2022-05-29: qty 1

## 2022-05-29 NOTE — ED Provider Notes (Signed)
[facility] Provider Note    Event Date/Time   First MD Initiated Contact with Patient 05/29/22 2141     (approximate)   History   Chief Complaint Abdominal Pain   HPI  [name] is a 67 y.o. female with past medical history of hypertension and asthma who presents to the ED complaining of abdominal pain.  Patient reports that she woke up this morning with severe pain in her epigastrium radiating towards her back.  She describes the pain as sharp and exacerbated if she tries to eat or drink anything.  She has been feeling nauseous with multiple episodes of vomiting, states she has been unable to keep anything down.  She admits to occasional alcohol consumption, does report that she had drank more than usual recently following a vacation.  She denies any history of pancreatitis and has never been told she has gallstones.  She does endorse some diarrhea but has not noticed any blood in her stool.  She denies any fevers, dysuria, hematuria, or flank pain.     Physical Exam   Triage Vital Signs: ED Triage Vitals  Enc Vitals Group     BP 05/29/22 1947 (!) 174/90     Pulse Rate 05/29/22 1947 73     Resp 05/29/22 1947 18     Temp 05/29/22 1947 98.4 F (36.9 C)     Temp Source 05/29/22 1947 Oral     SpO2 05/29/22 1947 100 %     Weight --      Height 05/29/22 1947 5' 1.5" (1.562 m)     Head Circumference --      Peak Flow --      Pain Score 05/29/22 2014 8     Pain Loc --      Pain Edu? --      Excl. in [place]? --     Most recent vital signs: Vitals:   05/29/22 1947  BP: (!) 174/90  Pulse: 73  Resp: 18  Temp: 98.4 F (36.9 C)  SpO2: 100%    Constitutional: Alert and oriented. Eyes: Conjunctivae are normal. Head: Atraumatic. Nose: No congestion/rhinnorhea. Mouth/Throat: Mucous membranes are moist.  Cardiovascular: Normal rate, regular rhythm. Grossly normal heart sounds.  2+ radial pulses bilaterally. Respiratory: Normal respiratory effort.   No retractions. Lungs CTAB. Gastrointestinal: Soft and tender to palpation in the epigastrium with no rebound or guarding. No distention. Musculoskeletal: No lower extremity tenderness nor edema.  Neurologic:  Normal speech and language. No gross focal neurologic deficits are appreciated.    ED Results / Procedures / Treatments   Labs (all labs ordered are listed, but only abnormal results are displayed) Labs Reviewed  CBC WITH DIFFERENTIAL/PLATELET - Abnormal; Notable for the following components:      Result Value   RDW 17.2 (*)    All other components within normal limits  COMPREHENSIVE METABOLIC PANEL - Abnormal; Notable for the following components:   Potassium 2.8 (*)    Glucose, Bld 144 (*)    BUN 7 (*)    AST 120 (*)    ALT 51 (*)    All other components within normal limits  LIPASE, BLOOD - Abnormal; Notable for the following components:   Lipase 536 (*)    All other components within normal limits  URINALYSIS, ROUTINE W REFLEX MICROSCOPIC - Abnormal; Notable for the following components:   Color, Urine YELLOW (*)    APPearance HAZY (*)    Hgb urine dipstick SMALL (*)  Protein, ur 100 (*)    Bacteria, UA RARE (*)    All other components within normal limits  MAGNESIUM - Abnormal; Notable for the following components:   Magnesium 1.6 (*)    All other components within normal limits  ETHANOL  TROPONIN I (HIGH SENSITIVITY)  TROPONIN I (HIGH SENSITIVITY)     EKG  ED ECG REPORT I, Blake Divine, the attending physician, personally viewed and interpreted this ECG.   Date: 05/29/2022  EKG Time: 19:52  Rate: 73  Rhythm: normal sinus rhythm  Axis: Normal  Intervals:none  ST&T Change: None  PROCEDURES:  Critical Care performed: No  Procedures   MEDICATIONS ORDERED IN ED: Medications  potassium chloride SA (KLOR-CON M) CR tablet 40 mEq (has no administration in time range)  magnesium sulfate IVPB 2 g 50 mL (has no administration in time range)   morphine (PF) 4 MG/ML injection 4 mg (4 mg Intravenous Given 05/29/22 2246)  ondansetron (ZOFRAN) injection 4 mg (4 mg Intravenous Given 05/29/22 2246)  lactated ringers bolus 1,000 mL (1,000 mLs Intravenous New Bag/Given 05/29/22 2246)  iohexol (OMNIPAQUE) 350 MG/ML injection 75 mL (75 mLs Intravenous Contrast Given 05/29/22 2250)     IMPRESSION / MDM / ASSESSMENT AND PLAN / ED COURSE  I reviewed the triage vital signs and the nursing notes.                              67 y.o. female with past medical history of hypertension and asthma who presents to the ED complaining of constant pain in her epigastrium radiating towards her back with nausea and vomiting since waking up this morning.  Patient's presentation is most consistent with acute presentation with potential threat to life or bodily function.  Differential diagnosis includes, but is not limited to, pancreatitis, biliary colic, cholecystitis, hepatitis, alcoholic gastritis, dehydration, electrolyte abnormality, AKI.  Patient uncomfortable appearing but nontoxic in no acute distress, vital signs are unremarkable.  She has significant tenderness in her epigastrium and lipase elevated consistent with pancreatitis.  This is likely due to her recent heavy alcohol consumption, patient also with mild transaminitis and AST greater than ALT consistent with this.  Bilirubin and alkaline phosphatase within normal limits and I doubt biliary obstruction, but given no prior history of pancreatitis, will further assess with CT imaging.  Patient noted to be hypokalemic and hypomagnesemic, will replete electrolytes but no AKI noted.  No significant anemia or leukocytosis noted.  Plan to treat symptomatically with IV morphine and Zofran, hydrate with IV fluids.  Patient turned over to oncoming provider pending CT results and likely admission.      FINAL CLINICAL IMPRESSION(S) / ED DIAGNOSES   Final diagnoses:  Alcohol-induced acute pancreatitis,  unspecified complication status     Rx / DC Orders   ED Discharge Orders     None        Note:  This document was prepared using Dragon voice recognition software and may include unintentional dictation errors.   Blake Divine, MD 05/29/22 [phone]

## 2022-05-29 NOTE — ED Triage Notes (Signed)
Pt presents to ER with c/o epigastric area abd pain that started this morning.  Pt states she has not been eating greasy or spicy foods.  Pt states she has been unable to keep any food or drink down today.  Pt also endorses some diarrhea.  Pt denies any other symptoms at this time.  Pt is otherwise A&O x4 at this time in NAD in triage.

## 2022-05-30 DIAGNOSIS — F17210 Nicotine dependence, cigarettes, uncomplicated: Secondary | ICD-10-CM | POA: Diagnosis present

## 2022-05-30 DIAGNOSIS — G8929 Other chronic pain: Secondary | ICD-10-CM | POA: Diagnosis present

## 2022-05-30 DIAGNOSIS — J45909 Unspecified asthma, uncomplicated: Secondary | ICD-10-CM | POA: Diagnosis present

## 2022-05-30 DIAGNOSIS — K852 Alcohol induced acute pancreatitis without necrosis or infection: Secondary | ICD-10-CM | POA: Diagnosis not present

## 2022-05-30 DIAGNOSIS — Z833 Family history of diabetes mellitus: Secondary | ICD-10-CM | POA: Diagnosis not present

## 2022-05-30 DIAGNOSIS — K760 Fatty (change of) liver, not elsewhere classified: Secondary | ICD-10-CM | POA: Diagnosis present

## 2022-05-30 DIAGNOSIS — Z79899 Other long term (current) drug therapy: Secondary | ICD-10-CM | POA: Diagnosis not present

## 2022-05-30 DIAGNOSIS — Z87898 Personal history of other specified conditions: Secondary | ICD-10-CM

## 2022-05-30 DIAGNOSIS — F419 Anxiety disorder, unspecified: Secondary | ICD-10-CM | POA: Diagnosis present

## 2022-05-30 DIAGNOSIS — Z808 Family history of malignant neoplasm of other organs or systems: Secondary | ICD-10-CM | POA: Diagnosis not present

## 2022-05-30 DIAGNOSIS — F1010 Alcohol abuse, uncomplicated: Secondary | ICD-10-CM | POA: Diagnosis present

## 2022-05-30 DIAGNOSIS — I10 Essential (primary) hypertension: Secondary | ICD-10-CM | POA: Diagnosis present

## 2022-05-30 DIAGNOSIS — K649 Unspecified hemorrhoids: Secondary | ICD-10-CM | POA: Diagnosis present

## 2022-05-30 DIAGNOSIS — Z841 Family history of disorders of kidney and ureter: Secondary | ICD-10-CM | POA: Diagnosis not present

## 2022-05-30 DIAGNOSIS — F1090 Alcohol use, unspecified, uncomplicated: Secondary | ICD-10-CM

## 2022-05-30 DIAGNOSIS — Z832 Family history of diseases of the blood and blood-forming organs and certain disorders involving the immune mechanism: Secondary | ICD-10-CM | POA: Diagnosis not present

## 2022-05-30 DIAGNOSIS — R197 Diarrhea, unspecified: Secondary | ICD-10-CM | POA: Diagnosis present

## 2022-05-30 DIAGNOSIS — H5789 Other specified disorders of eye and adnexa: Secondary | ICD-10-CM | POA: Diagnosis present

## 2022-05-30 DIAGNOSIS — Z83438 Family history of other disorder of lipoprotein metabolism and other lipidemia: Secondary | ICD-10-CM | POA: Diagnosis not present

## 2022-05-30 DIAGNOSIS — Z8249 Family history of ischemic heart disease and other diseases of the circulatory system: Secondary | ICD-10-CM | POA: Diagnosis not present

## 2022-05-30 LAB — CBC
HCT: 32.8 % — ABNORMAL LOW (ref 36.0–46.0)
Hemoglobin: 10.6 g/dL — ABNORMAL LOW (ref 12.0–15.0)
MCH: 28.6 pg (ref 26.0–34.0)
MCHC: 32.3 g/dL (ref 30.0–36.0)
MCV: 88.4 fL (ref 80.0–100.0)
Platelets: 280 10*3/uL (ref 150–400)
RBC: 3.71 MIL/uL — ABNORMAL LOW (ref 3.87–5.11)
RDW: 17.5 % — ABNORMAL HIGH (ref 11.5–15.5)
WBC: 7.5 10*3/uL (ref 4.0–10.5)
nRBC: 0.3 % — ABNORMAL HIGH (ref 0.0–0.2)

## 2022-05-30 LAB — COMPREHENSIVE METABOLIC PANEL
ALT: 39 U/L (ref 0–44)
AST: 73 U/L — ABNORMAL HIGH (ref 15–41)
Albumin: 4.1 g/dL (ref 3.5–5.0)
Alkaline Phosphatase: 48 U/L (ref 38–126)
Anion gap: 11 (ref 5–15)
BUN: 5 mg/dL — ABNORMAL LOW (ref 8–23)
CO2: 27 mmol/L (ref 22–32)
Calcium: 9.4 mg/dL (ref 8.9–10.3)
Chloride: 103 mmol/L (ref 98–111)
Creatinine, Ser: 0.44 mg/dL (ref 0.44–1.00)
GFR, Estimated: >60 mL/min (ref >60)
Glucose, Bld: 118 mg/dL — ABNORMAL HIGH (ref 70–99)
Potassium: 3.1 mmol/L — ABNORMAL LOW (ref 3.5–5.1)
Sodium: 141 mmol/L (ref 135–145)
Total Bilirubin: 1.1 mg/dL (ref 0.3–1.2)
Total Protein: 6.7 g/dL (ref 6.5–8.1)

## 2022-05-30 LAB — URINE DRUG SCREEN, QUALITATIVE (ARMC ONLY)
Amphetamines, Ur Screen: NOT DETECTED
Barbiturates, Ur Screen: NOT DETECTED
Benzodiazepine, Ur Scrn: NOT DETECTED
Cannabinoid 50 Ng, Ur ~~LOC~~: NOT DETECTED
Cocaine Metabolite,Ur ~~LOC~~: NOT DETECTED
MDMA (Ecstasy)Ur Screen: NOT DETECTED
Methadone Scn, Ur: NOT DETECTED
Opiate, Ur Screen: NOT DETECTED
Phencyclidine (PCP) Ur S: NOT DETECTED
Tricyclic, Ur Screen: NOT DETECTED

## 2022-05-30 LAB — MAGNESIUM: Magnesium: 2.1 mg/dL (ref 1.7–2.4)

## 2022-05-30 LAB — ETHANOL: Alcohol, Ethyl (B): <10 mg/dL (ref <10)

## 2022-05-30 LAB — TRIGLYCERIDES: Triglycerides: 77 mg/dL (ref <150)

## 2022-05-30 MED ORDER — MORPHINE SULFATE (PF) 4 MG/ML IV SOLN
4.0000 mg | Freq: Once | INTRAVENOUS | Status: AC
  Administered 2022-05-30: 4 mg via INTRAVENOUS
  Filled 2022-05-30: qty 1

## 2022-05-30 MED ORDER — LORAZEPAM 2 MG/ML IJ SOLN
0.0000 mg | Freq: Four times a day (QID) | INTRAMUSCULAR | Status: DC
  Administered 2022-05-30: 2 mg via INTRAVENOUS
  Administered 2022-05-30: 0 mg via INTRAVENOUS
  Filled 2022-05-30: qty 1

## 2022-05-30 MED ORDER — ADULT MULTIVITAMIN W/MINERALS CH
1.0000 | ORAL_TABLET | Freq: Every day | ORAL | Status: DC
  Administered 2022-05-30 – 2022-06-01 (×3): 1 via ORAL
  Filled 2022-05-30 (×3): qty 1

## 2022-05-30 MED ORDER — ACETAMINOPHEN 325 MG PO TABS: 650.0000 mg | ORAL_TABLET | Freq: Four times a day (QID) | ORAL | Status: DC | PRN

## 2022-05-30 MED ORDER — FOLIC ACID 1 MG PO TABS
1.0000 mg | ORAL_TABLET | Freq: Every day | ORAL | Status: DC
  Administered 2022-05-30 – 2022-06-01 (×3): 1 mg via ORAL
  Filled 2022-05-30 (×3): qty 1

## 2022-05-30 MED ORDER — ENOXAPARIN SODIUM 40 MG/0.4ML IJ SOSY
40.0000 mg | PREFILLED_SYRINGE | Freq: Every day | INTRAMUSCULAR | Status: DC
  Administered 2022-05-30 (×3): 40 mg via SUBCUTANEOUS
  Filled 2022-05-30 (×3): qty 0.4

## 2022-05-30 MED ORDER — THIAMINE MONONITRATE 100 MG PO TABS
100.0000 mg | ORAL_TABLET | Freq: Every day | ORAL | Status: DC
  Administered 2022-05-30 – 2022-05-31 (×2): 100 mg via ORAL
  Filled 2022-05-30 (×3): qty 1

## 2022-05-30 MED ORDER — ATORVASTATIN CALCIUM 20 MG PO TABS
10.0000 mg | ORAL_TABLET | Freq: Every day | ORAL | Status: DC
  Administered 2022-05-30: 10 mg via ORAL
  Filled 2022-05-30: qty 1

## 2022-05-30 MED ORDER — LORAZEPAM 2 MG/ML IJ SOLN: 1.0000 mg | INTRAMUSCULAR | Status: DC | PRN

## 2022-05-30 MED ORDER — HYDROMORPHONE HCL 1 MG/ML IJ SOLN
1.0000 mg | INTRAMUSCULAR | Status: DC | PRN
  Administered 2022-05-30 – 2022-05-31 (×3): 1 mg via INTRAVENOUS
  Filled 2022-05-30 (×4): qty 1

## 2022-05-30 MED ORDER — LATANOPROST 0.005 % OP SOLN
1.0000 [drp] | Freq: Every day | OPHTHALMIC | Status: DC
  Filled 2022-05-30: qty 2.5

## 2022-05-30 MED ORDER — NICOTINE 21 MG/24HR TD PT24
21.0000 mg | MEDICATED_PATCH | Freq: Every day | TRANSDERMAL | Status: DC
  Administered 2022-05-30 – 2022-06-01 (×3): 21 mg via TRANSDERMAL
  Filled 2022-05-30 (×3): qty 1

## 2022-05-30 MED ORDER — ONDANSETRON HCL 4 MG/2ML IJ SOLN
4.0000 mg | INTRAMUSCULAR | Status: AC
  Administered 2022-05-30: 4 mg via INTRAVENOUS
  Filled 2022-05-30: qty 2

## 2022-05-30 MED ORDER — HYDROXYZINE HCL 25 MG PO TABS
25.0000 mg | ORAL_TABLET | Freq: Three times a day (TID) | ORAL | Status: DC | PRN
  Administered 2022-05-30: 25 mg via ORAL
  Filled 2022-05-30: qty 1

## 2022-05-30 MED ORDER — LACTATED RINGERS IV SOLN: INTRAVENOUS | Status: DC

## 2022-05-30 MED ORDER — BIMATOPROST 0.01 % OP SOLN: 1.0000 [drp] | Freq: Every day | OPHTHALMIC | Status: DC

## 2022-05-30 MED ORDER — ONDANSETRON HCL 4 MG/2ML IJ SOLN
4.0000 mg | Freq: Four times a day (QID) | INTRAMUSCULAR | Status: DC | PRN
  Administered 2022-05-30: 4 mg via INTRAVENOUS
  Filled 2022-05-30 (×2): qty 2

## 2022-05-30 MED ORDER — ACETAMINOPHEN 650 MG RE SUPP: 650.0000 mg | Freq: Four times a day (QID) | RECTAL | Status: DC | PRN

## 2022-05-30 MED ORDER — OXYCODONE HCL 5 MG PO TABS
10.0000 mg | ORAL_TABLET | Freq: Every day | ORAL | Status: DC
  Administered 2022-05-30 – 2022-06-01 (×12): 10 mg via ORAL
  Filled 2022-05-30 (×12): qty 2

## 2022-05-30 MED ORDER — POTASSIUM CHLORIDE IN NACL 20-0.9 MEQ/L-% IV SOLN
INTRAVENOUS | Status: DC
  Filled 2022-05-30 (×9): qty 1000

## 2022-05-30 MED ORDER — AMLODIPINE BESYLATE 10 MG PO TABS
10.0000 mg | ORAL_TABLET | Freq: Every day | ORAL | Status: DC
  Administered 2022-05-30 – 2022-06-01 (×3): 10 mg via ORAL
  Filled 2022-05-30: qty 2
  Filled 2022-05-30 (×2): qty 1

## 2022-05-30 MED ORDER — THIAMINE HCL 100 MG/ML IJ SOLN: 100.0000 mg | Freq: Every day | INTRAMUSCULAR | Status: DC

## 2022-05-30 MED ORDER — LORAZEPAM 2 MG/ML IJ SOLN: 0.0000 mg | Freq: Two times a day (BID) | INTRAMUSCULAR | Status: DC

## 2022-05-30 MED ORDER — HYDRALAZINE HCL 20 MG/ML IJ SOLN: 5.0000 mg | INTRAMUSCULAR | Status: DC | PRN

## 2022-05-30 MED ORDER — ONDANSETRON HCL 4 MG PO TABS: 4.0000 mg | ORAL_TABLET | Freq: Four times a day (QID) | ORAL | Status: DC | PRN

## 2022-05-30 MED ORDER — LORAZEPAM 1 MG PO TABS: 1.0000 mg | ORAL_TABLET | ORAL | Status: DC | PRN

## 2022-05-30 MED ORDER — CYCLOSPORINE 0.05 % OP EMUL
1.0000 [drp] | Freq: Two times a day (BID) | OPHTHALMIC | Status: DC
  Administered 2022-05-30 – 2022-06-01 (×4): 1 [drp] via OPHTHALMIC
  Filled 2022-05-30 (×5): qty 30

## 2022-05-30 NOTE — Assessment & Plan Note (Signed)
Patient is on oxycodone 10 mg 5 times a day

## 2022-05-30 NOTE — Assessment & Plan Note (Signed)
Albuterol as needed 

## 2022-05-30 NOTE — ED Notes (Signed)
Pt placed on cardiac monitor at this time   Pt provided with phone to call family

## 2022-05-30 NOTE — Progress Notes (Signed)
Interim progress note not for billing  Hx chronic pain on opioids, distant history crack cocaine use, recent daily heavy drinking, presenting with one day epigastric abdominal pain, nbnb emesis, and diarrhea. Diarrhea resolved. Last emesis last night. Abd pain much improved. Lipase elevated and CT shows pancreatitis, normal biliary tree. Suspect this is alcohol-induced pancreatitis. Will continue fluids and clears, if continues to improve will plan on advancing diet tomorrow. Had ID testing last year so don't think need to repeat. Will check triglycerides. Last drink 6 days ago and no signs of withdrawal and denies history of withdrawal, will monitor. K is low so will replete. Does report small amount bright red rectal bleeding but has known hemorrhoids, is followed by gi for this, says bleeding is normal for her, thus will monitor. Hemodynamically and clinically stable, at this time pancreatitis appears to be mild.

## 2022-05-30 NOTE — Assessment & Plan Note (Addendum)
Clear liquid diet CT shows acute interstitial pancreatitis with no other acute findings Pain management IV Protonix, IV fluids, IV antiemetics Pain control

## 2022-05-30 NOTE — ED Provider Notes (Signed)
----------------------------------------- 12:34 AM on 05/30/2022 -----------------------------------------  Assuming care from Dr. Charna Archer.  In short, [name] is a 67 y.o. female with a chief complaint of abdominal pain and N/V.  Refer to the original H&P for additional details.  The current plan of care is to follow up CT scan and likely admit for pancreatitis.   Clinical Course as of 05/30/22 0352  Fri May 30, 2022  0025 I viewed and interpreted the patient's CT of the abdomen and pelvis.  There is an inflammatory process around the pancreas which seems consistent with pancreatitis.  The radiologist confirmed this diagnosis.  I reassessed the patient and she says she continues to have pain and nausea.  I ordered another morphine 4 mg IV and Zofran 4 mg IV.  I explained the results to her and we talked about going home versus staying in the hospital, and she and I both agree that she needs to be admitted for pain control and careful monitoring, IV hydration, bowel rest, analgesia, etc.  I consulted the hospitalist service for admission and discussed the case in detail with Dr. Damita Dunnings.  She will admit. [CF]    Clinical Course User Index [CF] Hinda Kehr, MD     Medications  oxyCODONE (Oxy IR/ROXICODONE) immediate release tablet 10 mg (has no administration in time range)  amLODipine (NORVASC) tablet 10 mg (has no administration in time range)  atorvastatin (LIPITOR) tablet 10 mg (10 mg Oral Given 05/30/22 0116)  LORazepam (ATIVAN) tablet 1-4 mg (has no administration in time range)    Or  LORazepam (ATIVAN) injection 1-4 mg (has no administration in time range)  thiamine (VITAMIN B1) tablet 100 mg (has no administration in time range)    Or  thiamine (VITAMIN B1) injection 100 mg (has no administration in time range)  folic acid (FOLVITE) tablet 1 mg (has no administration in time range)  multivitamin with minerals tablet 1 tablet (has no administration in time range)   enoxaparin (LOVENOX) injection 40 mg (40 mg Subcutaneous Given 05/30/22 0115)  acetaminophen (TYLENOL) tablet 650 mg (has no administration in time range)    Or  acetaminophen (TYLENOL) suppository 650 mg (has no administration in time range)  ondansetron (ZOFRAN) tablet 4 mg (has no administration in time range)    Or  ondansetron (ZOFRAN) injection 4 mg (has no administration in time range)  LORazepam (ATIVAN) injection 0-4 mg (2 mg Intravenous Given 05/30/22 0114)    Followed by  LORazepam (ATIVAN) injection 0-4 mg (has no administration in time range)  lactated ringers infusion ( Intravenous New Bag/Given 05/30/22 0115)  HYDROmorphone (DILAUDID) injection 1 mg (has no administration in time range)  hydrALAZINE (APRESOLINE) injection 5 mg (has no administration in time range)  morphine (PF) 4 MG/ML injection 4 mg (4 mg Intravenous Given 05/29/22 2246)  ondansetron (ZOFRAN) injection 4 mg (4 mg Intravenous Given 05/29/22 2246)  lactated ringers bolus 1,000 mL (0 mLs Intravenous Stopped 05/30/22 0059)  iohexol (OMNIPAQUE) 350 MG/ML injection 75 mL (75 mLs Intravenous Contrast Given 05/29/22 2250)  potassium chloride SA (KLOR-CON M) CR tablet 40 mEq (40 mEq Oral Given 05/29/22 2354)  magnesium sulfate IVPB 2 g 50 mL (0 g Intravenous Stopped 05/30/22 0059)  morphine (PF) 4 MG/ML injection 4 mg (4 mg Intravenous Given 05/30/22 0115)  ondansetron (ZOFRAN) injection 4 mg (4 mg Intravenous Given 05/30/22 0115)     ED Discharge Orders     None      Final diagnoses:  Alcohol-induced acute pancreatitis,  unspecified complication status     Hinda Kehr, MD 05/30/22 [phone]

## 2022-05-30 NOTE — Progress Notes (Signed)
       CROSS COVER NOTE  NAME: [name] MRN: [id number] DOB : 11-03-1955    HPI/Events of Note   "This patient is c/o anxiety. She was on CIWA in the Ed but it was dc'd. She states she would like something please"   Assessment and  Interventions   Assessment: Notes were reviewed and CIWA was discontinued as her last drink was 6 days prior without signs of withdrawal.  Plan: Hydroxyzine prn ordered for anxiety X X

## 2022-05-30 NOTE — ED Notes (Signed)
Pt taken to floor by inpatient nurse. No questions for handoff by inpatient nurse.
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
## 2022-05-30 NOTE — Assessment & Plan Note (Signed)
-   Continue amlodipine ?

## 2022-05-30 NOTE — Assessment & Plan Note (Signed)
CIWA withdrawal protocol

## 2022-05-30 NOTE — Assessment & Plan Note (Signed)
On no meds

## 2022-05-31 DIAGNOSIS — K852 Alcohol induced acute pancreatitis without necrosis or infection: Secondary | ICD-10-CM | POA: Diagnosis not present

## 2022-05-31 LAB — COMPREHENSIVE METABOLIC PANEL
ALT: 26 U/L (ref 0–44)
AST: 35 U/L (ref 15–41)
Albumin: 3.6 g/dL (ref 3.5–5.0)
Alkaline Phosphatase: 41 U/L (ref 38–126)
Anion gap: 5 (ref 5–15)
BUN: 7 mg/dL — ABNORMAL LOW (ref 8–23)
CO2: 26 mmol/L (ref 22–32)
Calcium: 8.4 mg/dL — ABNORMAL LOW (ref 8.9–10.3)
Chloride: 106 mmol/L (ref 98–111)
Creatinine, Ser: 0.48 mg/dL (ref 0.44–1.00)
GFR, Estimated: >60 mL/min (ref >60)
Glucose, Bld: 92 mg/dL (ref 70–99)
Potassium: 3.8 mmol/L (ref 3.5–5.1)
Sodium: 137 mmol/L (ref 135–145)
Total Bilirubin: 1 mg/dL (ref 0.3–1.2)
Total Protein: 5.8 g/dL — ABNORMAL LOW (ref 6.5–8.1)

## 2022-05-31 LAB — CBC
HCT: 29.9 % — ABNORMAL LOW (ref 36.0–46.0)
Hemoglobin: 9.4 g/dL — ABNORMAL LOW (ref 12.0–15.0)
MCH: 28.7 pg (ref 26.0–34.0)
MCHC: 31.4 g/dL (ref 30.0–36.0)
MCV: 91.2 fL (ref 80.0–100.0)
Platelets: 215 10*3/uL (ref 150–400)
RBC: 3.28 MIL/uL — ABNORMAL LOW (ref 3.87–5.11)
RDW: 17.2 % — ABNORMAL HIGH (ref 11.5–15.5)
WBC: 7.4 10*3/uL (ref 4.0–10.5)
nRBC: 0 % (ref 0.0–0.2)

## 2022-05-31 NOTE — Progress Notes (Signed)
PROGRESS NOTE    [name]  FVC:[id number] DOB: [birth year] DOA: 05/29/2022 PCP: Jerel Shepherd, FNP     Brief Narrative:   From admission h and p [name] is a 67 y.o. female with medical history significant for Chronic pain, HTN, anxiety, asthma and alcohol use disorder who presents to the ED with a complaint of abdominal pain that woke her up from sleep on the morning of 05/29/2022 subsequently followed by nausea and multiple episodes of nonbloody nonbilious vomiting.  The pain is in the epigastric area and radiates around both sides of the abdomen.  Patient reports that she recently returned from a trip to [place] when she did a lot of binge drinking.  She has a history of rectal bleeding for which she has seen GI but denies any blood in the stool or black stool.  Denies shortness of breath, palpitations, lightheadedness or chest pain. ED course and data review:Labs significant for lipase 536, AST 120 and ALT 51.  Potassium 2.8 and magnesium 1.6.Marland KitchenEKG, personally viewed and interpreted showing sinus at 73 with nonspecific ST-T wave changes.  CT abdomen and pelvis shows acute interstitial pancreatitis and hepatic steatosis. Patient was given an LR bolus, morphine for pain and given magnesium and potassium repletion.  Hospitalist consulted for admission.    Assessment & Plan:   Principal Problem:   Acute alcoholic pancreatitis Active Problems:   Alcohol use disorder   Chronic pain   Hypertension   Anxiety   Asthma   History of crack cocaine use  # Acute pancreatitis Likely 2/2 alcohol abuse. No signs biliary obstruction on CT. Triglycerides wnl. Diet advanced to soft this morning but patient had some emesis. Abdomen soft, LFTs normalized. - revert to clear liquids - zofran prn - continue fluids - home opioids for pain control  # Alcohol use disorder Last drink a week ago, no signs withdrawal, denies history of withdrawal - monitor  # History crack cocaine  use Says clean for 15 years. UDS negative. Negative ID testing last year  # Chronic pain On opioids at home - continue home opioids  # Tobacco abuse Absconded from unit today to smoke, she had previously been told this is against the rules and I shared that with her again. - continue nicotine patch  # HTN Controlled - cont home amlodipine  # Bleeding hemorrhoids Hx of this, reports intermittent brb when wipes. Colonoscopy 2 months ago otherwise normal. Hgb has dropped but suspect that is dilutional - monitor   DVT prophylaxis: lovenox Code Status: full Family Communication: none @ bedside, patient is fully cogent  Level of care: Med-Surg Status is: Inpatient Remains inpatient appropriate because: not tolerating food yet    Consultants:  none  Procedures: none  Antimicrobials:  none    Subjective: Abdominal pain much improved, attempted soft diet this morning but had emesis  Objective: Vitals:   05/30/22 1556 05/30/22 2044 05/31/22 0634 05/31/22 0833  BP: 130/69 139/69 118/79 115/65  Pulse: 68 78 75 63  Resp: '16 18  16  '$ Temp: 98.1 F (36.7 C) 98.4 F (36.9 C) 98.6 F (37 C) 98.3 F (36.8 C)  TempSrc: Oral Oral Oral   SpO2: 98% 100% 100% 97%  Weight:      Height:        Intake/Output Summary (Last 24 hours) at 05/31/2022 1122 Last data filed at 05/31/2022 0304 Gross per 24 hour  Intake 2346.68 ml  Output --  Net 2346.68 ml   Danley Danker  Weights   05/30/22 1209  Weight: 69.3 kg    Examination:  General exam: Appears calm and comfortable  Respiratory system: Clear to auscultation. Respiratory effort normal. Cardiovascular system: S1 & S2 heard, RRR. No JVD, murmurs, rubs, gallops or clicks. No pedal edema. Gastrointestinal system: Abdomen is nondistended, soft and mildly tender in the epigastrum. No organomegaly or masses felt. Normal bowel sounds heard. Central nervous system: Alert and oriented. No focal neurological deficits. Extremities: Symmetric  5 x 5 power. Skin: No rashes, lesions or ulcers Psychiatry: Judgement and insight appear normal. Mood & affect appropriate.     Data Reviewed: I have personally reviewed following labs and imaging studies  CBC: Recent Labs  Lab 05/29/22 2000 05/30/22 0606 05/31/22 0529  WBC 7.6 7.5 7.4  NEUTROABS 6.2  --   --   HGB 12.4 10.6* 9.4*  HCT 37.6 32.8* 29.9*  MCV 86.6 88.4 91.2  PLT 334 280 709   Basic Metabolic Panel: Recent Labs  Lab 05/29/22 2000 05/29/22 2224 05/30/22 0606 05/31/22 0529  NA 138  --  141 137  K 2.8*  --  3.1* 3.8  CL 102  --  103 106  CO2 26  --  27 26  GLUCOSE 144*  --  118* 92  BUN 7*  --  5* 7*  CREATININE 0.47  --  0.44 0.48  CALCIUM 10.0  --  9.4 8.4*  MG  --  1.6* 2.1  --    GFR: Estimated Creatinine Clearance: 64 mL/min (by C-G formula based on SCr of 0.48 mg/dL). Liver Function Tests: Recent Labs  Lab 05/29/22 2000 05/30/22 0606 05/31/22 0529  AST 120* 73* 35  ALT 51* 39 26  ALKPHOS 50 48 41  BILITOT 1.0 1.1 1.0  PROT 7.4 6.7 5.8*  ALBUMIN 4.7 4.1 3.6   Recent Labs  Lab 05/29/22 2000  LIPASE 536*   No results for input(s): "AMMONIA" in the last 168 hours. Coagulation Profile: No results for input(s): "INR", "PROTIME" in the last 168 hours. Cardiac Enzymes: No results for input(s): "CKTOTAL", "CKMB", "CKMBINDEX", "TROPONINI" in the last 168 hours. BNP (last 3 results) No results for input(s): "PROBNP" in the last 8760 hours. HbA1C: No results for input(s): "HGBA1C" in the last 72 hours. CBG: No results for input(s): "GLUCAP" in the last 168 hours. Lipid Profile: Recent Labs    05/30/22 0606  TRIG 77   Thyroid Function Tests: No results for input(s): "TSH", "T4TOTAL", "FREET4", "T3FREE", "THYROIDAB" in the last 72 hours. Anemia Panel: No results for input(s): "VITAMINB12", "FOLATE", "FERRITIN", "TIBC", "IRON", "RETICCTPCT" in the last 72 hours. Urine analysis:    Component Value Date/Time   COLORURINE YELLOW (A)  05/29/2022 2224   APPEARANCEUR HAZY (A) 05/29/2022 2224   LABSPEC 1.017 05/29/2022 2224   PHURINE 6.0 05/29/2022 2224   GLUCOSEU NEGATIVE 05/29/2022 2224   HGBUR SMALL (A) 05/29/2022 2224   BILIRUBINUR NEGATIVE 05/29/2022 2224   KETONESUR NEGATIVE 05/29/2022 2224   PROTEINUR 100 (A) 05/29/2022 2224   UROBILINOGEN 0.2 10/13/2007 0638   NITRITE NEGATIVE 05/29/2022 2224   LEUKOCYTESUR NEGATIVE 05/29/2022 2224   Sepsis Labs: '@LABRCNTIP'$ (procalcitonin:4,lacticidven:4)  )No results found for this or any previous visit (from the past 240 hour(s)).       Radiology Studies: CT Abdomen Pelvis W Contrast  Result Date: 05/29/2022 CLINICAL DATA:  Epigastric pain EXAM: CT ABDOMEN AND PELVIS WITH CONTRAST TECHNIQUE: Multidetector CT imaging of the abdomen and pelvis was performed using the standard protocol following bolus administration of  intravenous contrast. RADIATION DOSE REDUCTION: This exam was performed according to the departmental dose-optimization program which includes automated exposure control, adjustment of the mA and/or kV according to patient size and/or use of iterative reconstruction technique. CONTRAST:  60m OMNIPAQUE IOHEXOL 350 MG/ML SOLN COMPARISON:  None Available. FINDINGS: Lower chest: No acute abnormality. Hepatobiliary: Hepatic steatosis. Unremarkable gallbladder and biliary tree. Pancreas: Diffuse stranding/edema about the pancreas compatible with acute pancreatitis. No pancreatic hypoenhancement to suggest necrosis. The portal vein is patent. Spleen: Unremarkable. Adrenals/Urinary Tract: Normal adrenal glands. No urinary calculi or hydronephrosis. Unremarkable bladder. Stomach/Bowel: Normal caliber large and small bowel. Wall thickening about the transverse colon, gastric antrum and duodenum are favored reactive secondary to pancreatitis. Vascular/Lymphatic: Aortic atherosclerosis. No enlarged abdominal or pelvic lymph nodes. Reproductive: Lobular appearance of the uterus  favored due to fibroids. Other: No free intraperitoneal air. Musculoskeletal: No acute fracture. Chronic compression deformity of L3. IMPRESSION: Acute interstitial pancreatitis. Hepatic steatosis. Electronically Signed   By: TPlacido SouM.D.   On: 05/29/2022 23:08        Scheduled Meds:  amLODipine  10 mg Oral Daily   cycloSPORINE  1 drop Both Eyes BID   enoxaparin (LOVENOX) injection  40 mg Subcutaneous QHS   folic acid  1 mg Oral Daily   latanoprost  1 drop Both Eyes QHS   multivitamin with minerals  1 tablet Oral Daily   nicotine  21 mg Transdermal Daily   oxyCODONE  10 mg Oral 5 X Daily   thiamine  100 mg Oral Daily   Or   thiamine  100 mg Intravenous Daily   Continuous Infusions:  0.9 % NaCl with KCl 20 mEq / L 125 mL/hr at 05/31/22 0914     LOS: 1 day     NDesma Maxim MD [facility]   If 7PM-7AM, please contact night-coverage [email or web address] Password [email or web address] 05/31/2022, 11:22 AM

## 2022-06-01 DIAGNOSIS — K852 Alcohol induced acute pancreatitis without necrosis or infection: Secondary | ICD-10-CM | POA: Diagnosis not present

## 2022-06-01 LAB — COMPREHENSIVE METABOLIC PANEL
ALT: 22 U/L (ref 0–44)
AST: 24 U/L (ref 15–41)
Albumin: 3.3 g/dL — ABNORMAL LOW (ref 3.5–5.0)
Alkaline Phosphatase: 48 U/L (ref 38–126)
Anion gap: 5 (ref 5–15)
BUN: <5 mg/dL — ABNORMAL LOW (ref 8–23)
CO2: 26 mmol/L (ref 22–32)
Calcium: 8.8 mg/dL — ABNORMAL LOW (ref 8.9–10.3)
Chloride: 109 mmol/L (ref 98–111)
Creatinine, Ser: 0.42 mg/dL — ABNORMAL LOW (ref 0.44–1.00)
GFR, Estimated: >60 mL/min (ref >60)
Glucose, Bld: 95 mg/dL (ref 70–99)
Potassium: 3.8 mmol/L (ref 3.5–5.1)
Sodium: 140 mmol/L (ref 135–145)
Total Bilirubin: 0.9 mg/dL (ref 0.3–1.2)
Total Protein: 5.7 g/dL — ABNORMAL LOW (ref 6.5–8.1)

## 2022-06-01 LAB — CBC
HCT: 27.9 % — ABNORMAL LOW (ref 36.0–46.0)
Hemoglobin: 8.8 g/dL — ABNORMAL LOW (ref 12.0–15.0)
MCH: 28.7 pg (ref 26.0–34.0)
MCHC: 31.5 g/dL (ref 30.0–36.0)
MCV: 90.9 fL (ref 80.0–100.0)
Platelets: 202 10*3/uL (ref 150–400)
RBC: 3.07 MIL/uL — ABNORMAL LOW (ref 3.87–5.11)
RDW: 17.2 % — ABNORMAL HIGH (ref 11.5–15.5)
WBC: 6.8 10*3/uL (ref 4.0–10.5)
nRBC: 0 % (ref 0.0–0.2)

## 2022-06-01 MED ORDER — HYDROMORPHONE HCL 1 MG/ML IJ SOLN
0.5000 mg | Freq: Once | INTRAMUSCULAR | Status: AC
  Administered 2022-06-01: 0.5 mg via INTRAVENOUS
  Filled 2022-06-01: qty 0.5

## 2022-06-01 NOTE — TOC Initial Note (Signed)
Transition of Care [facility]) - Initial/Assessment Note    Patient Details  Name: [name] MRN: [id number] Date of Birth: 1956/04/14  [facility]) CM/SW Contact:    Jasmine Ivan, LCSW Phone Number: 06/01/2022, 10:16 AM  Clinical Narrative:                 [facility] consult for SA resources. Spoke to patient. Patient lives alone and drives herself to appointments. PCP is Alonza Smoker NP. Pharmacy is [facility] in [place].  Patient states she does not drink regularly and does not feel she needs SA resources. Patient states she sees a counselor which is very helpful for her. Patient denies TOC needs prior to DC.   Expected Discharge Plan: Home/Self Care Barriers to Discharge: Continued Medical Work up   Patient Goals and CMS Choice Patient states their goals for this hospitalization and ongoing recovery are:: home CMS Medicare.gov Compare Post Acute Care list provided to:: Patient Choice offered to / list presented to : Patient      Expected Discharge Plan and Services       Living arrangements for the past 2 months: Single Family Home                                      Prior Living Arrangements/Services Living arrangements for the past 2 months: [place] Lives with:: Self Patient language and need for interpreter reviewed:: Yes        Need for Family Participation in Patient Care: Yes (Comment) Care giver support system in place?: Yes (comment)   Criminal Activity/Legal Involvement Pertinent to Current Situation/Hospitalization: No - Comment as needed  Activities of Daily Living Home Assistive Devices/Equipment: None ADL Screening (condition at time of admission) Patient's cognitive ability adequate to safely complete daily activities?: Yes Is the patient deaf or have difficulty hearing?: No Does the patient have difficulty seeing, even when wearing glasses/contacts?: No Does the patient have difficulty concentrating, remembering, or  making decisions?: No Patient able to express need for assistance with ADLs?: Yes Does the patient have difficulty dressing or bathing?: No Independently performs ADLs?: Yes (appropriate for developmental age) Does the patient have difficulty walking or climbing stairs?: No Weakness of Legs: None Weakness of Arms/Hands: None  Permission Sought/Granted                  Emotional Assessment       Orientation: : Oriented to Self, Oriented to Situation, Oriented to Place, Oriented to  Time Alcohol / Substance Use: Not Applicable Psych Involvement: No (comment)  Admission diagnosis:  Acute alcoholic pancreatitis [W10.93] Alcohol-induced acute pancreatitis, unspecified complication status [A35.57] Patient Active Problem List   Diagnosis Date Noted   Acute alcoholic pancreatitis 32/20/2542   Hypertension 05/30/2022   Chronic pain 05/30/2022   Anxiety 05/30/2022   Asthma 05/30/2022   Alcohol use disorder 05/30/2022   History of crack cocaine use 05/30/2022   Lipoma of back 10/28/2017   Hx of adenomatous colonic polyps 02/20/2016   HEMORRHOIDS 02/22/2009   Constipation 11/21/2008   RECTAL BLEEDING 11/21/2008   PCP:  Jerel Shepherd, FNP Pharmacy:   [facility] [place], [place] - [address] [place] [address] Phone: [phone] Fax: [phone]  [phone] - [place], [place] [address]  [place] [address] Phone: [phone] Fax: [phone]     Social Determinants of Health (SDOH) Social History: SDOH Screenings   Food Insecurity: No Food Insecurity (05/30/2022)  Housing: Low Risk  (05/30/2022)  Transportation Needs: No Transportation Needs (05/30/2022)  Utilities: Not At Risk (05/30/2022)  Alcohol Screen: Low Risk  (10/02/2020)  Depression (PHQ2-9): Medium Risk (10/02/2020)  Financial Resource Strain: High Risk (10/02/2020)  Physical  Activity: Insufficiently Active (10/02/2020)  Social Connections: Moderately Integrated (10/02/2020)  Stress: Stress Concern Present (10/02/2020)  Tobacco Use: High Risk (05/29/2022)   SDOH Interventions:     Readmission Risk Interventions     No data to display

## 2022-06-01 NOTE — Progress Notes (Signed)
The pt is refusing scheduled lovenox, states that her rectal bleeding seems to be getting worse since she's been here, and would rather not take tonight's dose. Hassan Rowan Morrision made aware.

## 2022-06-01 NOTE — Discharge Summary (Addendum)
[name] FMB:[id number] DOB: [birth year] DOA: 05/29/2022  PCP: Jerel Shepherd, FNP  Admit date: 05/29/2022 Discharge date: 06/01/2022  Time spent: 35 minutes  Recommendations for Outpatient Follow-up:  Pcp and GI f/u, check CBC at f/u     Discharge Diagnoses:  Principal Problem:   Acute alcoholic pancreatitis Active Problems:   Alcohol use disorder   Chronic pain   Hypertension   Anxiety   Asthma   History of crack cocaine use   Discharge Condition: stable  Diet recommendation: soft diet, push fluids  Filed Weights   05/30/22 1209  Weight: 69.3 kg    History of present illness:  From admission h and p [name] is a 67 y.o. female with medical history significant for Chronic pain, HTN, anxiety, asthma and alcohol use disorder who presents to the ED with a complaint of abdominal pain that woke her up from sleep on the morning of 05/29/2022 subsequently followed by nausea and multiple episodes of nonbloody nonbilious vomiting.  The pain is in the epigastric area and radiates around both sides of the abdomen.  Patient reports that she recently returned from a trip to [place] when she did a lot of binge drinking.  She has a history of rectal bleeding for which she has seen GI but denies any blood in the stool or black stool.  Denies shortness of breath, palpitations, lightheadedness or chest pain.   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 CT shows acute uncomplicated pancreatitis. Triglycerides normal, no gallstones or biliary duct dilitation to suggest gallstone etiology. Patient reports heavy daily drinking, a pint a day, for the past month after a trip to [place]. This thus likely represents alcohol pancreatitis. We treated with clear liquid diet and fluids. Symptoms improved and on day of  discharge diet was advanced which she tolerated. When I explained that the cause of her pancreatitis is likely alcohol abuse she grew extremely upset. There was also an incident where she left the hospital to smoke requiring security to be notified, this despite being told by nursing she is not allowed to leave the hospital to smoke. I advised PCP f/u for ongoing management of patient's polysubstance use problems (opioids, alcohol, tobacco). Of note patient has a history of bleeding hemorrhoids and is s/p colonoscopy in December that showed hemorrhoids but was otherwise normal. She reports intermittent bright red bleeding before and since that procedure. Here hemoglobin did drift to 8.8 from 10.6 on presentation, think this is partially dilutional as received a total of 6 liters IV fluids. She has hydrocortisone suppositories at home and I advised she continue to use them, also advise close PCP hospital f/u and also close GI f/u, and gave bleeding ED precautions.   Procedures: none   Consultations: none  Discharge Exam: Vitals:   06/01/22 0430 06/01/22 0815  BP: (!) 115/55 117/76  Pulse: 80 76  Resp: 18 18  Temp: 98.7 F (37.1 C) 98.4 F (36.9 C)  SpO2: 100% 100%    General: NAD Cardiovascular: RRR Respiratory: CTAB Abdomen: soft, mild epigastric tenderness, no rebound  Discharge Instructions   Discharge Instructions     Diet - low sodium heart healthy   Complete by: As directed    Increase activity slowly   Complete by: As directed       Allergies as of 06/01/2022  Reactions   Ace Inhibitors Swelling   Propoxyphene N-acetaminophen Nausea And Vomiting   Tape Rash        Medication List     STOP taking these medications    ibuprofen 800 MG tablet Commonly known as: ADVIL       TAKE these medications    amLODipine 10 MG tablet Commonly known as: NORVASC Take 10 mg by mouth daily.   atorvastatin 10 MG tablet Commonly known as: LIPITOR Take 10 mg by  mouth at bedtime.   cetirizine 10 MG tablet Commonly known as: ZYRTEC Take 10 mg by mouth daily.   cycloSPORINE 0.05 % ophthalmic emulsion Commonly known as: RESTASIS Place 1 drop into both eyes 2 (two) times daily.   hydrocortisone 2.5 % rectal cream Commonly known as: ANUSOL-HC Place 1 Application rectally 2 (two) times daily. Per rectum for rectal bleeding   linaclotide 290 MCG Caps capsule Commonly known as: Linzess TAKE 1 CAPSULE BY MOUTH EVERY MORNING before breakfast   Lumigan 0.01 % Soln Generic drug: bimatoprost Place 1 drop into both eyes at bedtime.   montelukast 10 MG tablet Commonly known as: SINGULAIR Take 10 mg by mouth at bedtime.   Oxycodone HCl 20 MG Tabs Take 10 mg by mouth 5 (five) times daily.   oxyCODONE 15 MG immediate release tablet Commonly known as: ROXICODONE Take 15 mg by mouth every 6 (six) hours.   Ventolin HFA 108 (90 Base) MCG/ACT inhaler Generic drug: albuterol Inhale 2 puffs into the lungs every 4 (four) hours as needed for wheezing.   Vitamin D3 50 MCG (2000 UT) capsule Take 2,000 Units by mouth daily.       Allergies  Allergen Reactions   Ace Inhibitors Swelling   Propoxyphene N-Acetaminophen Nausea And Vomiting   Tape Rash    Follow-up Information     Cobb, Bunnie Pion, FNP Follow up.   Specialty: Family Medicine Contact information: [address] [place] [address] [phone]         Annitta Needs, NP Follow up.   Specialty: Gastroenterology Contact information: [address] [place] [address] [phone]                  The results of significant diagnostics from this hospitalization (including imaging, microbiology, ancillary and laboratory) are listed below for reference.    Significant Diagnostic Studies: CT Abdomen Pelvis W Contrast  Result Date: 05/29/2022 CLINICAL DATA:  Epigastric pain EXAM: CT ABDOMEN AND PELVIS WITH CONTRAST TECHNIQUE: Multidetector CT  imaging of the abdomen and pelvis was performed using the standard protocol following bolus administration of intravenous contrast. RADIATION DOSE REDUCTION: This exam was performed according to the departmental dose-optimization program which includes automated exposure control, adjustment of the mA and/or kV according to patient size and/or use of iterative reconstruction technique. CONTRAST:  29m OMNIPAQUE IOHEXOL 350 MG/ML SOLN COMPARISON:  None Available. FINDINGS: Lower chest: No acute abnormality. Hepatobiliary: Hepatic steatosis. Unremarkable gallbladder and biliary tree. Pancreas: Diffuse stranding/edema about the pancreas compatible with acute pancreatitis. No pancreatic hypoenhancement to suggest necrosis. The portal vein is patent. Spleen: Unremarkable. Adrenals/Urinary Tract: Normal adrenal glands. No urinary calculi or hydronephrosis. Unremarkable bladder. Stomach/Bowel: Normal caliber large and small bowel. Wall thickening about the transverse colon, gastric antrum and duodenum are favored reactive secondary to pancreatitis. Vascular/Lymphatic: Aortic atherosclerosis. No enlarged abdominal or pelvic lymph nodes. Reproductive: Lobular appearance of the uterus favored due to fibroids. Other: No free intraperitoneal air. Musculoskeletal: No acute fracture.  Chronic compression deformity of L3. IMPRESSION: Acute interstitial pancreatitis. Hepatic steatosis. Electronically Signed   By: Placido Sou M.D.   On: 05/29/2022 23:08    Microbiology: No results found for this or any previous visit (from the past 240 hour(s)).   Labs: Basic Metabolic Panel: Recent Labs  Lab 05/29/22 2000 05/29/22 2224 05/30/22 0606 05/31/22 0529 06/01/22 0613  NA 138  --  141 137 140  K 2.8*  --  3.1* 3.8 3.8  CL 102  --  103 106 109  CO2 26  --  '27 26 26  '$ GLUCOSE 144*  --  118* 92 95  BUN 7*  --  5* 7* <5*  CREATININE 0.47  --  0.44 0.48 0.42*  CALCIUM 10.0  --  9.4 8.4* 8.8*  MG  --  1.6* 2.1  --   --     Liver Function Tests: Recent Labs  Lab 05/29/22 2000 05/30/22 0606 05/31/22 0529 06/01/22 0613  AST 120* 73* 35 24  ALT 51* 39 26 22  ALKPHOS 50 48 41 48  BILITOT 1.0 1.1 1.0 0.9  PROT 7.4 6.7 5.8* 5.7*  ALBUMIN 4.7 4.1 3.6 3.3*   Recent Labs  Lab 05/29/22 2000  LIPASE 536*   No results for input(s): "AMMONIA" in the last 168 hours. CBC: Recent Labs  Lab 05/29/22 2000 05/30/22 0606 05/31/22 0529 06/01/22 0613  WBC 7.6 7.5 7.4 6.8  NEUTROABS 6.2  --   --   --   HGB 12.4 10.6* 9.4* 8.8*  HCT 37.6 32.8* 29.9* 27.9*  MCV 86.6 88.4 91.2 90.9  PLT 334 280 215 202   Cardiac Enzymes: No results for input(s): "CKTOTAL", "CKMB", "CKMBINDEX", "TROPONINI" in the last 168 hours. BNP: BNP (last 3 results) No results for input(s): "BNP" in the last 8760 hours.  ProBNP (last 3 results) No results for input(s): "PROBNP" in the last 8760 hours.  CBG: No results for input(s): "GLUCAP" in the last 168 hours.     Signed:  Desma Maxim MD.  [facility] 06/01/2022, 10:42 AM

## 2022-06-06 ENCOUNTER — Emergency Department (HOSPITAL_COMMUNITY)
Admission: EM | Admit: 2022-06-06 | Discharge: 2022-06-06 | Disposition: A | Discharge status: Home / Self Care | Payer: Medicaid Other | Attending: Emergency Medicine | Admitting: Emergency Medicine

## 2022-06-06 ENCOUNTER — Encounter (HOSPITAL_COMMUNITY): Payer: Self-pay | Admitting: *Deleted

## 2022-06-06 ENCOUNTER — Other Ambulatory Visit: Payer: Self-pay

## 2022-06-06 DIAGNOSIS — I10 Essential (primary) hypertension: Secondary | ICD-10-CM | POA: Diagnosis not present

## 2022-06-06 DIAGNOSIS — E876 Hypokalemia: Secondary | ICD-10-CM | POA: Diagnosis not present

## 2022-06-06 DIAGNOSIS — Z79899 Other long term (current) drug therapy: Secondary | ICD-10-CM | POA: Diagnosis not present

## 2022-06-06 DIAGNOSIS — R001 Bradycardia, unspecified: Secondary | ICD-10-CM

## 2022-06-06 HISTORY — DX: Acute pancreatitis without necrosis or infection, unspecified: K85.90

## 2022-06-06 LAB — CBC WITH DIFFERENTIAL/PLATELET
Abs Immature Granulocytes: 0.03 10*3/uL (ref 0.00–0.07)
Basophils Absolute: 0 10*3/uL (ref 0.0–0.1)
Basophils Relative: 0 %
Eosinophils Absolute: 0.1 10*3/uL (ref 0.0–0.5)
Eosinophils Relative: 1 %
HCT: 32 % — ABNORMAL LOW (ref 36.0–46.0)
Hemoglobin: 10.3 g/dL — ABNORMAL LOW (ref 12.0–15.0)
Immature Granulocytes: 0 %
Lymphocytes Relative: 49 %
Lymphs Abs: 3.7 10*3/uL (ref 0.7–4.0)
MCH: 28.5 pg (ref 26.0–34.0)
MCHC: 32.2 g/dL (ref 30.0–36.0)
MCV: 88.6 fL (ref 80.0–100.0)
Monocytes Absolute: 0.6 10*3/uL (ref 0.1–1.0)
Monocytes Relative: 8 %
Neutro Abs: 3.2 10*3/uL (ref 1.7–7.7)
Neutrophils Relative %: 42 %
Platelets: 548 10*3/uL — ABNORMAL HIGH (ref 150–400)
RBC: 3.61 MIL/uL — ABNORMAL LOW (ref 3.87–5.11)
RDW: 17.2 % — ABNORMAL HIGH (ref 11.5–15.5)
WBC: 7.6 10*3/uL (ref 4.0–10.5)
nRBC: 0 % (ref 0.0–0.2)

## 2022-06-06 LAB — BASIC METABOLIC PANEL
Anion gap: 10 (ref 5–15)
BUN: 10 mg/dL (ref 8–23)
CO2: 25 mmol/L (ref 22–32)
Calcium: 9.5 mg/dL (ref 8.9–10.3)
Chloride: 103 mmol/L (ref 98–111)
Creatinine, Ser: 0.54 mg/dL (ref 0.44–1.00)
GFR, Estimated: >60 mL/min (ref >60)
Glucose, Bld: 90 mg/dL (ref 70–99)
Potassium: 3.1 mmol/L — ABNORMAL LOW (ref 3.5–5.1)
Sodium: 138 mmol/L (ref 135–145)

## 2022-06-06 LAB — MAGNESIUM: Magnesium: 2 mg/dL (ref 1.7–2.4)

## 2022-06-06 NOTE — ED Triage Notes (Signed)
Pt sent here from PCP today due to low HR, was 47. HR 50-54 in triage.

## 2022-06-06 NOTE — ED Provider Notes (Signed)
[facility] Provider Note   CSN: [id number] Arrival date & time: 06/06/22  1622     History  Chief Complaint  Patient presents with   Bradycardia    [name] is a 67 y.o. female.  HPI   This patient is a 67 year old female, she has a history of hypertension on amlodipine, no beta-blockers or calcium channel blockers, this patient was admitted to the hospital approximately 8 days ago with alcohol induced pancreatitis, she has a history of hypertension on amlodipine, no history of cardiac disease whatsoever.  She presents to the hospital today with a complaint of a slow heart rate.  She was actually in a follow-up visit with her doctor after her hospitalization when she was noted to have a heart rate in the 40s, the patient is asymptomatic otherwise.  She has no chest pain or shortness of breath, she has no vomiting or diarrhea, no coughing, no headache, no fever.  Home Medications Prior to Admission medications   Medication Sig Start Date End Date Taking? Authorizing Provider  amLODipine (NORVASC) 10 MG tablet Take 10 mg by mouth daily.    [provider]  atorvastatin (LIPITOR) 10 MG tablet Take 10 mg by mouth at bedtime. 09/11/20   [provider]  cetirizine (ZYRTEC) 10 MG tablet Take 10 mg by mouth daily.    [provider]  Cholecalciferol (VITAMIN D3) 50 MCG (2000 UT) capsule Take 2,000 Units by mouth daily. Patient not taking: Reported on 05/30/2022    [provider]  cycloSPORINE (RESTASIS) 0.05 % ophthalmic emulsion Place 1 drop into both eyes 2 (two) times daily.     [provider]  hydrocortisone (ANUSOL-HC) 2.5 % rectal cream Place 1 Application rectally 2 (two) times daily. Per rectum for rectal bleeding Patient not taking: Reported on 05/30/2022 03/19/22   Annitta Needs, NP  linaclotide [facility]) 290 MCG CAPS capsule TAKE 1 CAPSULE BY MOUTH EVERY MORNING before breakfast 10/01/21    Mahala Menghini, PA-C  LUMIGAN 0.01 % SOLN Place 1 drop into both eyes at bedtime. 10/01/20   [provider]  montelukast (SINGULAIR) 10 MG tablet Take 10 mg by mouth at bedtime. 01/05/16   [provider]  oxyCODONE (ROXICODONE) 15 MG immediate release tablet Take 15 mg by mouth every 6 (six) hours. 05/20/22   [provider]  Oxycodone HCl 20 MG TABS Take 10 mg by mouth 5 (five) times daily. 03/17/22   [provider]  VENTOLIN HFA 108 (90 Base) MCG/ACT inhaler Inhale 2 puffs into the lungs every 4 (four) hours as needed for wheezing. 05/17/22   [provider]      Allergies    Ace inhibitors, Propoxyphene n-acetaminophen, and Tape    Review of Systems   Review of Systems  All other systems reviewed and are negative.   Physical Exam Updated Vital Signs BP (!) 156/66 (BP Location: Right Arm)   Pulse (!) 53   Temp 98.3 F (36.8 C) (Oral)   Resp 14   Ht 1.562 m (5' 1.5")   Wt 68 kg   SpO2 100%   BMI 27.88 kg/m  Physical Exam Vitals and nursing note reviewed.  Constitutional:      General: She is not in acute distress.    Appearance: She is well-developed.  HENT:     Head: Normocephalic and atraumatic.     Mouth/Throat:     Pharynx: No oropharyngeal exudate.  Eyes:  General: No scleral icterus.       Right eye: No discharge.        Left eye: No discharge.     Conjunctiva/sclera: Conjunctivae normal.     Pupils: Pupils are equal, round, and reactive to light.  Neck:     Thyroid: No thyromegaly.     Vascular: No JVD.  Cardiovascular:     Rate and Rhythm: Regular rhythm. Bradycardia present.     Heart sounds: Normal heart sounds. No murmur heard.    No friction rub. No gallop.     Comments: Heart rate of around 55 bpm with normal pulses, no JVD, no peripheral edema, no murmur Pulmonary:     Effort: Pulmonary effort is normal. No respiratory distress.     Breath sounds: Normal breath sounds. No wheezing or rales.  Abdominal:      General: Bowel sounds are normal. There is no distension.     Palpations: Abdomen is soft. There is no mass.     Tenderness: There is no abdominal tenderness.  Musculoskeletal:        General: No tenderness. Normal range of motion.     Cervical back: Normal range of motion and neck supple.     Right lower leg: No edema.     Left lower leg: No edema.  Lymphadenopathy:     Cervical: No cervical adenopathy.  Skin:    General: Skin is warm and dry.     Findings: No erythema or rash.  Neurological:     Mental Status: She is alert.     Coordination: Coordination normal.  Psychiatric:        Behavior: Behavior normal.     ED Results / Procedures / Treatments   Labs (all labs ordered are listed, but only abnormal results are displayed) Labs Reviewed  CBC WITH DIFFERENTIAL/PLATELET - Abnormal; Notable for the following components:      Result Value   RBC 3.61 (*)    Hemoglobin 10.3 (*)    HCT 32.0 (*)    RDW 17.2 (*)    Platelets 548 (*)    All other components within normal limits  BASIC METABOLIC PANEL - Abnormal; Notable for the following components:   Potassium 3.1 (*)    All other components within normal limits  MAGNESIUM    EKG EKG Interpretation  Date/Time:  Friday June 06 2022 17:04:00 EST Ventricular Rate:  46 PR Interval:  154 QRS Duration: 78 QT Interval:  424 QTC Calculation: 371 R Axis:   66 Text Interpretation: Sinus bradycardia Nonspecific T wave abnormality Abnormal ECG When compared with ECG of 29-May-2022 19:52, Vent. rate has decreased BY  27 BPM Nonspecific T wave abnormality has replaced inverted T waves in Anterior leads QT has shortened Confirmed by Noemi Chapel [phone]) on 06/06/2022 6:20:09 PM  Radiology No results found.  Procedures Procedures    Medications Ordered in ED Medications - No data to display  ED Course/ Medical Decision Making/ A&P                             Medical Decision Making Amount and/or Complexity of  Data Reviewed Labs: ordered.   This patient presents to the ED for concern of bradycardia, seemingly asymptomatic, this involves an extensive number of treatment options, and is a complaint that carries with it a high risk of complications and morbidity.  The differential diagnosis includes heart block, electrolyte abnormalities, may be normal variant  Co morbidities that complicate the patient evaluation  Hypertension, alcohol use   Additional history obtained:  Additional history obtained from electronic medical record External records from outside source obtained and reviewed including reviewed notes from patient's recent admission to the hospital for alcohol induced pancreatitis   Lab Tests:  I Ordered, and personally interpreted labs.  The pertinent results include: Mild hypokalemia, no other findings   Cardiac Monitoring: / EKG:  The patient was maintained on a cardiac monitor.  I personally viewed and interpreted the cardiac monitored which showed an underlying rhythm of: Mild sinus bradycardia, no other arrhythmias or ectopy   Consultations Obtained:  Follow-up with cardiology outpatient   Problem List / ED Course / Critical interventions / Medication management  Patient has been asymptomatic with normal blood pressure at 150/66, no hypotension no severe hypertension no fever no hypoxia and a heart rate between 50 and 65 I reviewed the patient's medications, she is not on any AV nodal blocking agents I have reviewed the patients home medicines and have made adjustments as needed   Social Determinants of Health:  Alcohol use   Test / Admission - Considered:  Considered admission but the patient is asymptomatic and has no signs of a high-grade block, stable for discharge followed up with cardiology         Final Clinical Impression(s) / ED Diagnoses Final diagnoses:  Bradycardia  Hypokalemia    Rx / DC Orders ED Discharge Orders     None          Noemi Chapel, MD 06/06/22 [phone]

## 2022-06-06 NOTE — Discharge Instructions (Signed)
Your testing today was reassuring.  Your potassium was only slightly low so you can take some foods that were high in potassium.  See the attached list  Please follow-up with the heart doctor, you should be seen within the week.  If you develop severe weakness nausea vomiting chest pain shortness of breath or worsening symptoms return to the ER immediately.

## 2022-06-06 NOTE — ED Triage Notes (Signed)
Pt denies any SOB or dizziness.

## 2022-06-10 ENCOUNTER — Encounter: Payer: Self-pay | Admitting: Cardiovascular Disease

## 2022-06-10 ENCOUNTER — Ambulatory Visit: Payer: Medicaid Other | Attending: Cardiovascular Disease | Admitting: Cardiovascular Disease

## 2022-06-10 VITALS — BP 122/64 | HR 63 | Ht 61.5 in | Wt 150.2 lb

## 2022-06-10 DIAGNOSIS — R001 Bradycardia, unspecified: Secondary | ICD-10-CM | POA: Diagnosis not present

## 2022-06-10 NOTE — Patient Instructions (Signed)
Medication Instructions:  Your physician recommends that you continue on your current medications as directed. Please refer to the Current Medication list given to you today.  *If you need a refill on your cardiac medications before your next appointment, please call your pharmacy*   Lab Work: NONE If you have labs (blood work) drawn today and your tests are completely normal, you will receive your results only by: [place] (if you have MyChart) OR A paper copy in the mail If you have any lab test that is abnormal or we need to change your treatment, we will call you to review the results.   Testing/Procedures: NONE   Follow-Up: At [facility], you and your health needs are our priority.  As part of our continuing mission to provide you with exceptional heart care, we have created designated Provider Care Teams.  These Care Teams include your primary Cardiologist (physician) and Advanced Practice Providers (APPs -  Physician Assistants and Nurse Practitioners) who all work together to provide you with the care you need, when you need it.  We recommend signing up for the patient portal called "MyChart".  Sign up information is provided on this After Visit Summary.  MyChart is used to connect with patients for Virtual Visits (Telemedicine).  Patients are able to view lab/test results, encounter notes, upcoming appointments, etc.  Non-urgent messages can be sent to your provider as well.   To learn more about what you can do with MyChart, go to [email or web address].    Your next appointment:   1 year(s)  Provider:   Mertie Moores, MD
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
## 2022-11-20 ENCOUNTER — Other Ambulatory Visit: Payer: Self-pay | Admitting: Gastroenterology

## 2023-01-08 ENCOUNTER — Other Ambulatory Visit (HOSPITAL_COMMUNITY): Payer: Self-pay | Admitting: Family Medicine

## 2023-01-08 DIAGNOSIS — Z1231 Encounter for screening mammogram for malignant neoplasm of breast: Secondary | ICD-10-CM

## 2023-01-14 ENCOUNTER — Inpatient Hospital Stay (HOSPITAL_COMMUNITY): Admission: RE | Admit: 2023-01-14 | Payer: Medicaid Other | Source: Ambulatory Visit

## 2023-11-28 ENCOUNTER — Other Ambulatory Visit: Payer: Self-pay | Admitting: Internal Medicine

## 2024-01-12 ENCOUNTER — Other Ambulatory Visit: Payer: Self-pay | Admitting: Internal Medicine

## 2024-01-27 ENCOUNTER — Other Ambulatory Visit: Payer: Self-pay

## 2024-01-27 ENCOUNTER — Other Ambulatory Visit: Payer: Self-pay | Admitting: Internal Medicine

## 2024-01-27 MED ORDER — LINACLOTIDE 290 MCG PO CAPS: 290.0000 ug | ORAL_CAPSULE | Freq: Every day | ORAL | 0 refills | Status: DC

## 2024-02-11 ENCOUNTER — Other Ambulatory Visit: Payer: Self-pay

## 2024-02-11 MED ORDER — LINACLOTIDE 290 MCG PO CAPS: 290.0000 ug | ORAL_CAPSULE | Freq: Every day | ORAL | 0 refills | Status: DC

## 2024-02-19 ENCOUNTER — Telehealth: Payer: Self-pay

## 2024-02-19 NOTE — Telephone Encounter (Signed)
 Pt called and left a vm stating that she is unable to be seen until Nov and is needing samples or an Rx of linzess  sent to her pharmacy to get her to her appt. Pt stated that she is having to use gloves and manually relieve herself due to having severe constipation at times causing her to bleed.

## 2024-02-19 NOTE — Telephone Encounter (Signed)
 Phoned the pt and advised of Rx for Linzess  being sent to her pharmacy on 02/11/2024. Pt states she didn't know. I told her it may be put back but to tell them to fill it. Pt expressed understanding.

## 2024-03-07 ENCOUNTER — Ambulatory Visit (INDEPENDENT_AMBULATORY_CARE_PROVIDER_SITE_OTHER): Admitting: Gastroenterology

## 2024-03-07 ENCOUNTER — Encounter: Payer: Self-pay | Admitting: Gastroenterology

## 2024-03-07 VITALS — BP 130/66 | HR 93 | Temp 98.6°F | Ht 61.5 in | Wt 148.2 lb

## 2024-03-07 DIAGNOSIS — K5900 Constipation, unspecified: Secondary | ICD-10-CM

## 2024-03-07 MED ORDER — LUBIPROSTONE 24 MCG PO CAPS: 24.0000 ug | ORAL_CAPSULE | Freq: Two times a day (BID) | ORAL | 5 refills | Status: AC

## 2024-03-07 NOTE — Patient Instructions (Signed)
 Stop Linzess .  Start lubiprostone 24mcg twice daily with food for constipation. You cannot take Linzess  and lubiprostone during the same days. Stop Linzess  comletely for now.  If your stools are not moving better after one week, please let me know. Continue to drink plenty of water, at least 80 ounces daily. Continue to increase dietary fiber to help with constipation.  Return in 3-4 weeks, we will reassess your constipation and address fatty liver at that time. I would recommend reducing alcohol use with goal of stopping.

## 2024-03-07 NOTE — Progress Notes (Signed)
 GI Office Note    Referring Provider: Malachy Burnard Helling, FNP Primary Care Physician:  Malachy Burnard Helling, FNP  Primary Gastroenterologist: Carlin POUR. Cindie, DO  Chief Complaint   Chief Complaint  Patient presents with   Follow-up    Pt here for refills on Linzess , follow up constipation    History of Present Illness   [name] is a 68 y.o. female presenting today for follow up of constipation. Remote history of colonic adenomas, due for surveillance in 2025.    Discussed the use of AI scribe software for clinical note transcription with the patient, who gave verbal consent to proceed.  History of Present Illness   [name] is a 67 year old female who presents with worsening constipation and rectal bleeding.  She has a history of constipation, which has worsened over the past month due to a lapse in her medication. She typically takes her Linzess  290mcg daily but ran out of medication. She notes it previously worked very well. Recently after resuming Linzess , she decided to take twice daily because she had not had a BM in nearly a week and once daily was not working. She is only having BM every 3 days or so. Not as effective as it has been in the past. She notes having rectal bleeding associated with constipation. She has had to disimpact herself. She notes she never returned for hemorrhoid banding after her last colonoscopy. Felt her hemorrhoid swelling also contributed to difficulty having a BM.    She has been drinking prune juice since Friday and typically drinks three to four bottles of water daily. She occasionally drinks ginger ale when her stomach is upset and uses lemon-flavored water enhancers. She has not consumed alcohol since October 21st, although she drank regularly during her birthday month.  Notes long history of frequent/heavy alcohol use. Denies heartburn. No dysphagia. She had admission early 2024 for etoh pancreatitis/etoh hepatitis occurring after  returning from a trip to [place], self reported binge drinking while on her trip. She is on chronic pain medication for 15+years.   She was previously on atorvastatin  for cholesterol but has not been taking it reporting that her cholesterol levels were good during her last check-up (off medication).     Prior Data   06/2023: Cre 0.63, albumin 5, total bilirubin 0.3, alk phos 70, AST 22, ALT 14, total cholesterol 236, LDL 146.  August 2024: White blood cell count 6.2, hemoglobin 12.9, platelets 298   CT abdomen pelvis with contrast February 2024: IMPRESSION: Acute interstitial pancreatitis. Hepatic steatosis.  Colonoscopy 01/2019: external/internal hemorrhoids, tortuous colon. Next colonoscopy in 5 years.    Colonoscopy December 2023: - Hemorrhoids found on perianal exam - Nonbleeding internal hemorrhoids - Consider hemorrhoid banding, one of her hemorrhoids with evidence of irritation, prolapsed on the rectum exam, if no improvement after banding, consider repeat flexible sigmoidoscopy in 6 months to reevaluate.  Medications   Current Outpatient Medications  Medication Sig Dispense Refill   amLODipine  (NORVASC ) 10 MG tablet Take 10 mg by mouth daily.     cetirizine (ZYRTEC) 10 MG tablet Take 10 mg by mouth daily.     Cholecalciferol (VITAMIN D3) 50 MCG (2000 UT) capsule Take 2,000 Units by mouth daily.     cycloSPORINE  (RESTASIS ) 0.05 % ophthalmic emulsion Place 1 drop into both eyes 2 (two) times daily.      linaclotide  (LINZESS ) 290 MCG CAPS capsule Take 1 capsule (290 mcg total) by mouth daily  before breakfast. 30 capsule 0   montelukast (SINGULAIR) 10 MG tablet Take 10 mg by mouth at bedtime.  0   oxyCODONE  (ROXICODONE ) 15 MG immediate release tablet Take 15 mg by mouth every 6 (six) hours.     VENTOLIN HFA 108 (90 Base) MCG/ACT inhaler Inhale 2 puffs into the lungs every 4 (four) hours as needed for wheezing.                         No current facility-administered  medications for this visit.    Allergies   Allergies as of 03/07/2024 - Review Complete 03/07/2024  Allergen Reaction Noted   Ace inhibitors Swelling    Propoxyphene n-acetaminophen  Nausea And Vomiting    Tape Rash 02/20/2016     Review of Systems   General: Negative for anorexia, weight loss, fever, chills, fatigue, weakness. ENT: Negative for hoarseness, difficulty swallowing , nasal congestion. CV: Negative for chest pain, angina, palpitations, dyspnea on exertion, peripheral edema.  Respiratory: Negative for dyspnea at rest, dyspnea on exertion, cough, sputum, wheezing.  GI: See history of present illness. GU:  Negative for dysuria, hematuria, urinary incontinence, urinary frequency, nocturnal urination.  Endo: Negative for unusual weight change.     Physical Exam   BP 130/66   Pulse 93   Temp 98.6 F (37 C)   Ht 5' 1.5 (1.562 m)   Wt 148 lb 3.2 oz (67.2 kg)   BMI 27.55 kg/m    General: Well-nourished, well-developed in no acute distress.  Eyes: No icterus. Mouth: Oropharyngeal mucosa moist and pink   Lungs: Clear to auscultation bilaterally.  Heart: Regular rate and rhythm, no murmurs rubs or gallops.  Abdomen: Bowel sounds are normal,  nondistended, no hepatosplenomegaly or masses,  no abdominal bruits or hernia , no rebound or guarding. Mild epigastric tenderness Rectal: prolapsed hemorrhoid noted, grade 3, not irritated or evidence of bleeding. Nontender rectal exam, no stool present, secretions are heme negative. Extremities: No lower extremity edema. No clubbing or deformities. Neuro: Alert and oriented x 4   Skin: Warm and dry, no jaundice.   Psych: Alert and cooperative, normal mood and affect.  Labs   Lab Results  Component Value Date   NA 138 06/06/2022   CL 103 06/06/2022   K 3.1 (L) 06/06/2022   CO2 25 06/06/2022   BUN 10 06/06/2022   CREATININE 0.54 06/06/2022   GFRNONAA >60 06/06/2022   CALCIUM  9.5 06/06/2022   ALBUMIN 3.3 (L) 06/01/2022    GLUCOSE 90 06/06/2022   Lab Results  Component Value Date   WBC 7.6 06/06/2022   HGB 10.3 (L) 06/06/2022   HCT 32.0 (L) 06/06/2022   MCV 88.6 06/06/2022   PLT 548 (H) 06/06/2022   Lab Results  Component Value Date   ALT 22 06/01/2022   AST 24 06/01/2022   ALKPHOS 48 06/01/2022   BILITOT 0.9 06/01/2022   Lab Results  Component Value Date   LIPASE 536 (H) 05/29/2022   No results found for: IRON, TIBC, FERRITIN No results found for: VITAMINB12 No results found for: FOLATE  Imaging Studies   No results found.  Assessment/Plan:   Chronic constipation Recent exacerbation due to medication lapse. Linzess  has been ineffective despite resuming daily usage. Patient increased to BID on her own, above recommended daily dosage. Still not having effective BMs.  -drink at least 80 ounces of water daily -prune juice can be used -increase dietary fiber -stop Linzess  -start lubiprostone 24mcg BID  with food, call if not effective.  -return in 3-4 weeks  Prolapsed internal hemorrhoids Prolapsed internal hemorrhoids with recent bleeding. Colonoscopy in 2023 indicated prolapse and irritation. Current examination shows prolapse in two columns, likely due to constipation and straining. Rectal exam, showed prolapsed hemorrhoid reducible but prolapses spontaneously. -avoid straining -lubiprostone 24mcg BID with food, call if not effective.   -consider hemorrhoid banding in the future if needed, larger prolapsed hemorrhoid may not be treatable by banding  Hepatic steatosis: -return to address a future visit in 3-4 weeks      Sonny RAMAN. Ezzard, MHS, PA-C [facility]

## 2024-03-21 ENCOUNTER — Ambulatory Visit: Admitting: Gastroenterology

## 2024-04-05 ENCOUNTER — Ambulatory Visit: Admitting: Gastroenterology

## 2024-04-05 ENCOUNTER — Encounter: Payer: Self-pay | Admitting: Gastroenterology

## 2024-04-05 VITALS — BP 137/70 | HR 112 | Temp 99.0°F | Ht 61.5 in | Wt 149.0 lb

## 2024-04-05 DIAGNOSIS — K760 Fatty (change of) liver, not elsewhere classified: Secondary | ICD-10-CM | POA: Insufficient documentation

## 2024-04-05 DIAGNOSIS — K5900 Constipation, unspecified: Secondary | ICD-10-CM

## 2024-04-05 NOTE — Patient Instructions (Signed)
 Continue lubiprostone  24mcg take one pill twice daily with food for constipation.  Please take my orders when you go for labs with your PCP. Let me know when you have them completed so we can make sure we get a copy.   If you decide to pursue hemorrhoid banding, please let me know and we will get you on Therisa Stager or [place]'s schedule for banding.   Continue to refrain from alcohol use.   We will see you back in six months for follow up. Please reach out sooner if needed.

## 2024-04-05 NOTE — Progress Notes (Signed)
 GI Office Note    Referring Provider: Malachy Burnard Helling, FNP Primary Care Physician:  Malachy Burnard Helling, FNP  Primary Gastroenterologist: Carlin POUR. Cindie, DO   Chief Complaint   Chief Complaint  Patient presents with   Follow-up    Follow up. No problems     History of Present Illness   [name] is a 68 y.o. female presenting today for follow up. Last seen in 02/2024. She has constipation, history of colonic adenomas. She also has history of frequent/heavy alcohol use, hepatic steatosis.   Discussed the use of AI scribe software for clinical note transcription with the patient, who gave verbal consent to proceed.  History of Present Illness [name] is a 68 year old female who presents for follow up.   She has been experiencing constipation, which has improved with a lubiprostone . She has hard time remembering the second daily dose. About one week ago she started taking both pills at the same time. She is having softer stools about every 2-3 days. Feels much improved.   Her hemorrhoids have also improved, with less bleeding and discomfort. Previously, the patient reported that her hemorrhoids would prolapse and sometimes require manual reduction. She has a history of significant bleeding before her current treatment regimen.  She has a history of fatty liver, noted on imaging, and has abstained from alcohol since October 21st 2025. She mentions a family history of fatty liver, with her sister and cousin also affected.  She has not had recent liver function tests since March 2025, but previous results were normal.  No current issues with heartburn or indigestion.    She mentions a weight gain over the years, noting that she used to weigh around 140 pounds but has increased to 173 pounds but currently she has been able to get back down to 149 pounds. Currently not working out. .       Prior Data     Results   06/2023: glucose 77, cre 0.63, Tbili 0.3, AP  70, AST 22, ALT 14,   CT abdomen pelvis with contrast February 2024: IMPRESSION: Acute interstitial pancreatitis. Hepatic steatosis.  Colonoscopy 01/2019: external/internal hemorrhoids, tortuous colon. Next colonoscopy in 5 years.     Colonoscopy December 2023: - Hemorrhoids found on perianal exam - Nonbleeding internal hemorrhoids - Consider hemorrhoid banding, one of her hemorrhoids with evidence of irritation, prolapsed on the rectum exam, if no improvement after banding, consider repeat flexible sigmoidoscopy in 6 months to reevaluate.    Medications   Current Outpatient Medications  Medication Sig Dispense Refill   amLODipine  (NORVASC ) 10 MG tablet Take 10 mg by mouth daily.     cetirizine (ZYRTEC) 10 MG tablet Take 10 mg by mouth daily.     cyclobenzaprine (FLEXERIL) 10 MG tablet TAKE 1 TABLET BY MOUTH TWICE A DAY AFTER MEALS     cycloSPORINE  (RESTASIS ) 0.05 % ophthalmic emulsion Place 1 drop into both eyes 2 (two) times daily.      lubiprostone  (AMITIZA ) 24 MCG capsule Take 1 capsule (24 mcg total) by mouth 2 (two) times daily with a meal. 60 capsule 5   montelukast (SINGULAIR) 10 MG tablet Take 10 mg by mouth at bedtime.  0   oxyCODONE  (ROXICODONE ) 15 MG immediate release tablet Take 15 mg by mouth every 6 (six) hours.     VENTOLIN HFA 108 (90 Base) MCG/ACT inhaler Inhale 2 puffs into the lungs every 4 (four) hours as needed for wheezing.  No current facility-administered medications for this visit.    Allergies   Allergies as of 04/05/2024 - Review Complete 04/05/2024  Allergen Reaction Noted   Ace inhibitors Swelling    Propoxyphene n-acetaminophen  Nausea And Vomiting    Tape Rash 02/20/2016       Review of Systems   General: Negative for anorexia, weight loss, fever, chills, fatigue, weakness. ENT: Negative for hoarseness, difficulty swallowing , nasal congestion. CV: Negative for chest pain, angina, palpitations, dyspnea on exertion, peripheral edema.   Respiratory: Negative for dyspnea at rest, dyspnea on exertion, cough, sputum, wheezing.  GI: See history of present illness. GU:  Negative for dysuria, hematuria, urinary incontinence, urinary frequency, nocturnal urination.  Endo: Negative for unusual weight change.     Physical Exam   BP 137/70 (BP Location: Right Arm, Patient Position: Sitting, Cuff Size: Normal)   Pulse (!) 112   Temp 99 F (37.2 C) (Temporal)   Ht 5' 1.5 (1.562 m)   Wt 149 lb (67.6 kg)   BMI 27.70 kg/m    General: Well-nourished, well-developed in no acute distress.  Eyes: No icterus. Mouth: Oropharyngeal mucosa moist and pink  .  Abdomen: Bowel sounds are normal, nontender, nondistended, no hepatosplenomegaly or masses,  no abdominal bruits or hernia , no rebound or guarding.  Rectal: not performed Extremities: No lower extremity edema. No clubbing or deformities. Neuro: Alert and oriented x 4   Skin: Warm and dry, no jaundice.   Psych: Alert and cooperative, normal mood and affect.  Labs   See above  Imaging Studies   No results found.  Assessment/Plan:   Assessment & Plan Chronic constipation Improving with current medication regimen. Bowel movements every two to three days. Medication taken once daily due to forgetting second dose. She plans to buy a pill organizer with AM/PM doses.  - Resume taking medication twice daily as previously prescribed.   Prolapsed internal hemorrhoids (grade III) Grade III prolapsed hemorrhoids with occasional bleeding and discomfort. Doing better with better constipation management. Patient unsure if she wants to purse hemorrhoid banding.  - Discussed hemorrhoid banding procedure with regards to limitations, potential complications. She will let us  know if she decides to pursue.    Hepatic steatosis in setting of chronic etoh use.  She has been sober since 02/16/24. Normal liver function tests in March 2025. - Ordered liver function tests, cbc, bmet, lipase  for March 2026. - Provided lab orders for primary care provider, as patient does not like needles and wants to have done with her next PCP labs.  - ov six months             Sonny RAMAN. Ezzard, MHS, PA-C [facility]

## 2024-04-24 ENCOUNTER — Other Ambulatory Visit: Payer: Self-pay

## 2024-04-24 ENCOUNTER — Emergency Department (HOSPITAL_COMMUNITY)
Admission: EM | Admit: 2024-04-24 | Discharge: 2024-04-24 | Disposition: A | Discharge status: Home / Self Care | Attending: Emergency Medicine | Admitting: Emergency Medicine

## 2024-04-24 ENCOUNTER — Encounter (HOSPITAL_COMMUNITY): Payer: Self-pay

## 2024-04-24 DIAGNOSIS — Z760 Encounter for issue of repeat prescription: Secondary | ICD-10-CM | POA: Insufficient documentation

## 2024-04-24 MED ORDER — OXYCODONE HCL 15 MG PO TABS: 15.0000 mg | ORAL_TABLET | ORAL | 0 refills | Status: AC | PRN

## 2024-04-24 MED ORDER — OXYCODONE HCL 5 MG PO TABS
15.0000 mg | ORAL_TABLET | Freq: Once | ORAL | Status: AC
  Administered 2024-04-24: 15 mg via ORAL
  Filled 2024-04-24: qty 3

## 2024-04-24 NOTE — ED Triage Notes (Signed)
 Pt requesting refill of oxycodone  15 mg. Pt goes to a pain clinic, but her provider is out for the holiday and she has run out. Pt receives the medication for chronic back and knee pain. Pt took her dose on 12/25. Pt with noticeable tremors in triage. Pt also reports nausea and difficulty sleeping.

## 2024-04-24 NOTE — ED Provider Notes (Signed)
 " [place] EMERGENCY DEPARTMENT AT [facility] Provider Note   CSN: [id number] Arrival date & time: 04/24/24  2042     Patient presents with: Medication Refill   [name] is a 68 y.o. female.    Medication Refill Presents for medication refill on her oxycodone .  States as of Wednesday last week was post get her medicine filled.  States she is at the appointment some reason did not fill it.  States her doctor is out of town.  Last had pill on Thursday.  Now on Sunday started to feel withdrawal.  However.  Has had some diarrhea.  No nausea.  Sees a pain clinic.     Prior to Admission medications  Medication Sig Start Date End Date Taking? Authorizing Provider  oxyCODONE  (ROXICODONE ) 15 MG immediate release tablet Take 1 tablet (15 mg total) by mouth every 4 (four) hours as needed for pain. 04/24/24  Yes Patsey Lot, MD  amLODipine  (NORVASC ) 10 MG tablet Take 10 mg by mouth daily.    [provider]  cetirizine (ZYRTEC) 10 MG tablet Take 10 mg by mouth daily.    [provider]  cyclobenzaprine (FLEXERIL) 10 MG tablet TAKE 1 TABLET BY MOUTH TWICE A DAY AFTER MEALS    [provider]  cycloSPORINE  (RESTASIS ) 0.05 % ophthalmic emulsion Place 1 drop into both eyes 2 (two) times daily.     [provider]  lubiprostone  (AMITIZA ) 24 MCG capsule Take 1 capsule (24 mcg total) by mouth 2 (two) times daily with a meal. 03/07/24   Ezzard Sonny RAMAN, PA-C  montelukast (SINGULAIR) 10 MG tablet Take 10 mg by mouth at bedtime. 01/05/16   [provider]  oxyCODONE  (ROXICODONE ) 15 MG immediate release tablet Take 15 mg by mouth every 6 (six) hours. 05/20/22   [provider]  VENTOLIN HFA 108 (90 Base) MCG/ACT inhaler Inhale 2 puffs into the lungs every 4 (four) hours as needed for wheezing. 05/17/22   [provider]    Allergies: Ace inhibitors, Propoxyphene n-acetaminophen , and Tape    Review of Systems  Updated  Vital Signs BP (!) 179/89 (BP Location: Right Arm)   Pulse (!) 107   Temp 98.6 F (37 C) (Oral)   Resp 20   Ht 5' 1.5 (1.562 m)   Wt 68 kg   SpO2 100%   BMI 27.88 kg/m   Physical Exam Vitals and nursing note reviewed.  Cardiovascular:     Rate and Rhythm: Regular rhythm.  Chest:     Chest wall: No tenderness.  Abdominal:     Tenderness: There is no abdominal tenderness.  Neurological:     Mental Status: She is alert.     (all labs ordered are listed, but only abnormal results are displayed) Labs Reviewed - No data to display  EKG: None  Radiology: No results found.   Procedures   Medications Ordered in the ED  oxyCODONE  (Oxy IR/ROXICODONE ) immediate release tablet 15 mg (15 mg Oral Given 04/24/24 2131)                                    Medical Decision Making Risk Prescription drug management.   Patient on chronic pain meds.  Has run out of her pills.  Reviewing records it appears that she should be out at this time.  She has only been getting the pills from the 1 provider and  I think with the holidays it is reasonable that she has not been able to get it filled due to issues with the provider.  Discussed with patient and I think she can get 5 pills for home.  Will also give dose here.  Informed her to potentially where the pain contract.  Will discharge.     Final diagnoses:  Medication refill    ED Discharge Orders          Ordered    oxyCODONE  (ROXICODONE ) 15 MG immediate release tablet  Every 4 hours PRN        04/24/24 2131               Patsey Lot, MD 04/24/24 2332  "

## 2024-04-24 NOTE — Discharge Instructions (Signed)
Follow-up with your pain medicine doctor.

## 2024-05-04 ENCOUNTER — Ambulatory Visit: Attending: Cardiovascular Disease | Admitting: Internal Medicine

## 2024-05-04 NOTE — Progress Notes (Incomplete)
" °  Cardiology Office Note:  .   Date:  05/04/2024  ID:  [name], DOB 1956/02/09, MRN [id number] PCP: Malachy Burnard Helling, FNP  [facility] Providers Cardiologist:  None { Click to update primary MD,subspecialty MD or APP then REFRESH:1}   History of Present Illness: .    [name] is a 69 y.o. female who is a former patient of Dr. Alveta and previously followed for asymptomatic bradycardia with intact chronotropic response, hypertension, alcohol use and hepatic steatosis and presents today for annual follow-up.  Discussed the use of AI scribe software for clinical note transcription with the patient, who gave verbal consent to proceed.  History of Present Illness       ROS: Remaining review of systems negative  Studies Reviewed: .        Results  Risk Assessment/Calculations:   {Does this patient have ATRIAL FIBRILLATION?:[id number]} No BP recorded.  {Refresh Note OR Click here to enter BP  :1}***       Physical Exam:   VS:  There were no vitals taken for this visit.   Wt Readings from Last 3 Encounters:  04/24/24 150 lb (68 kg)  04/05/24 149 lb (67.6 kg)  03/07/24 148 lb 3.2 oz (67.2 kg)    GEN: Well nourished, well developed in no acute distress NECK: No JVD; No carotid bruits CARDIAC: ***RRR, no murmurs, no rubs, no gallops RESPIRATORY:  Clear to auscultation without rales, wheezing or rhonchi  ABDOMEN: Soft, non-tender, non-distended EXTREMITIES:  No edema; No deformity   ASSESSMENT AND PLAN: .    Assessment and Plan Assessment & Plan        {Are you ordering a CV Procedure (e.g. stress test, cath, DCCV, TEE, etc)?   Press F2        :[id number]}    Follow up: ***  Signed, Emeline FORBES Calender, DO  05/04/2024 12:51 PM    [facility] "
